# Patient Record
Sex: Female | Born: 1950 | Race: White | Hispanic: No | Smoking: Never smoker
Health system: Southern US, Community
[De-identification: ages and names within clinical notes are randomized; demographics above are authoritative.]

## PROBLEM LIST (undated history)

## (undated) DIAGNOSIS — I10 Essential (primary) hypertension: Secondary | ICD-10-CM

## (undated) DIAGNOSIS — L570 Actinic keratosis: Secondary | ICD-10-CM

## (undated) DIAGNOSIS — T7840XA Allergy, unspecified, initial encounter: Secondary | ICD-10-CM

## (undated) DIAGNOSIS — D649 Anemia, unspecified: Secondary | ICD-10-CM

## (undated) HISTORY — DX: Anemia, unspecified: D64.9

## (undated) HISTORY — DX: Actinic keratosis: L57.0

## (undated) HISTORY — PX: TUBAL LIGATION: SHX77

## (undated) HISTORY — DX: Essential (primary) hypertension: I10

## (undated) HISTORY — DX: Allergy, unspecified, initial encounter: T78.40XA

---

## 1999-07-05 ENCOUNTER — Other Ambulatory Visit: Admission: RE | Admit: 1999-07-05 | Discharge: 1999-07-05 | Payer: Self-pay | Admitting: Family Medicine

## 2001-09-07 ENCOUNTER — Other Ambulatory Visit: Admission: RE | Admit: 2001-09-07 | Discharge: 2001-09-07 | Payer: Self-pay | Admitting: Family Medicine

## 2002-09-21 ENCOUNTER — Other Ambulatory Visit: Admission: RE | Admit: 2002-09-21 | Discharge: 2002-09-21 | Payer: Self-pay | Admitting: Family Medicine

## 2003-09-28 ENCOUNTER — Other Ambulatory Visit: Admission: RE | Admit: 2003-09-28 | Discharge: 2003-09-28 | Payer: Self-pay | Admitting: Family Medicine

## 2004-11-27 ENCOUNTER — Ambulatory Visit: Payer: Self-pay | Admitting: Family Medicine

## 2004-11-27 ENCOUNTER — Other Ambulatory Visit: Admission: RE | Admit: 2004-11-27 | Discharge: 2004-11-27 | Payer: Self-pay | Admitting: Family Medicine

## 2004-12-09 ENCOUNTER — Ambulatory Visit: Payer: Self-pay | Admitting: Family Medicine

## 2004-12-18 ENCOUNTER — Encounter: Admission: RE | Admit: 2004-12-18 | Discharge: 2004-12-18 | Payer: Self-pay | Admitting: Family Medicine

## 2004-12-20 ENCOUNTER — Encounter: Admission: RE | Admit: 2004-12-20 | Discharge: 2004-12-20 | Payer: Self-pay | Admitting: Family Medicine

## 2005-01-01 ENCOUNTER — Ambulatory Visit: Payer: Self-pay | Admitting: Family Medicine

## 2006-01-16 ENCOUNTER — Ambulatory Visit: Payer: Self-pay | Admitting: Family Medicine

## 2006-01-16 ENCOUNTER — Other Ambulatory Visit: Admission: RE | Admit: 2006-01-16 | Discharge: 2006-01-16 | Payer: Self-pay | Admitting: Family Medicine

## 2006-01-16 ENCOUNTER — Encounter: Payer: Self-pay | Admitting: Family Medicine

## 2006-03-12 ENCOUNTER — Encounter: Admission: RE | Admit: 2006-03-12 | Discharge: 2006-03-12 | Payer: Self-pay | Admitting: Family Medicine

## 2006-03-19 ENCOUNTER — Encounter: Admission: RE | Admit: 2006-03-19 | Discharge: 2006-03-19 | Payer: Self-pay | Admitting: Family Medicine

## 2006-03-20 ENCOUNTER — Ambulatory Visit: Payer: Self-pay | Admitting: Family Medicine

## 2007-01-29 ENCOUNTER — Ambulatory Visit: Payer: Self-pay | Admitting: Family Medicine

## 2007-01-29 ENCOUNTER — Encounter: Payer: Self-pay | Admitting: Family Medicine

## 2007-01-29 ENCOUNTER — Other Ambulatory Visit: Admission: RE | Admit: 2007-01-29 | Discharge: 2007-01-29 | Payer: Self-pay | Admitting: Family Medicine

## 2007-01-29 LAB — CONVERTED CEMR LAB: Pap Smear: NORMAL

## 2007-03-24 ENCOUNTER — Encounter: Admission: RE | Admit: 2007-03-24 | Discharge: 2007-03-24 | Payer: Self-pay | Admitting: Family Medicine

## 2007-03-29 ENCOUNTER — Telehealth (INDEPENDENT_AMBULATORY_CARE_PROVIDER_SITE_OTHER): Payer: Self-pay | Admitting: *Deleted

## 2007-12-29 ENCOUNTER — Encounter: Payer: Self-pay | Admitting: Family Medicine

## 2008-03-01 ENCOUNTER — Encounter: Payer: Self-pay | Admitting: Family Medicine

## 2008-03-01 ENCOUNTER — Ambulatory Visit: Payer: Self-pay | Admitting: Family Medicine

## 2008-03-01 ENCOUNTER — Other Ambulatory Visit: Admission: RE | Admit: 2008-03-01 | Discharge: 2008-03-01 | Payer: Self-pay | Admitting: Family Medicine

## 2008-03-01 DIAGNOSIS — E785 Hyperlipidemia, unspecified: Secondary | ICD-10-CM | POA: Insufficient documentation

## 2008-03-01 DIAGNOSIS — R3911 Hesitancy of micturition: Secondary | ICD-10-CM | POA: Insufficient documentation

## 2008-03-01 DIAGNOSIS — K219 Gastro-esophageal reflux disease without esophagitis: Secondary | ICD-10-CM

## 2008-03-01 DIAGNOSIS — I10 Essential (primary) hypertension: Secondary | ICD-10-CM

## 2008-03-01 DIAGNOSIS — N951 Menopausal and female climacteric states: Secondary | ICD-10-CM | POA: Insufficient documentation

## 2008-03-01 DIAGNOSIS — N6019 Diffuse cystic mastopathy of unspecified breast: Secondary | ICD-10-CM

## 2008-03-01 DIAGNOSIS — M810 Age-related osteoporosis without current pathological fracture: Secondary | ICD-10-CM | POA: Insufficient documentation

## 2008-03-01 LAB — CONVERTED CEMR LAB
Bilirubin Urine: NEGATIVE
Epithelial cells, urine: 1 /lpf
Nitrite: NEGATIVE
RBC / HPF: 0
Specific Gravity, Urine: 1.01
Urine crystals, microscopic: 0 /hpf

## 2008-03-02 LAB — CONVERTED CEMR LAB
AST: 21 units/L (ref 0–37)
Alkaline Phosphatase: 66 units/L (ref 39–117)
BUN: 13 mg/dL (ref 6–23)
Basophils Absolute: 0 10*3/uL (ref 0.0–0.1)
Bilirubin, Direct: 0.1 mg/dL (ref 0.0–0.3)
Chloride: 102 meq/L (ref 96–112)
Eosinophils Absolute: 0 10*3/uL (ref 0.0–0.7)
Eosinophils Relative: 0.5 % (ref 0.0–5.0)
GFR calc non Af Amer: 69 mL/min
HDL: 53.1 mg/dL (ref >39.0)
MCV: 93.3 fL (ref 78.0–100.0)
Neutrophils Relative %: 73.4 % (ref 43.0–77.0)
Platelets: 329 10*3/uL (ref 150–400)
Potassium: 3.4 meq/L — ABNORMAL LOW (ref 3.5–5.1)
Total Bilirubin: 1.2 mg/dL (ref 0.3–1.2)
VLDL: 18 mg/dL (ref 0–40)
WBC: 5.4 10*3/uL (ref 4.5–10.5)

## 2008-03-06 ENCOUNTER — Encounter (INDEPENDENT_AMBULATORY_CARE_PROVIDER_SITE_OTHER): Payer: Self-pay | Admitting: *Deleted

## 2008-03-29 ENCOUNTER — Encounter: Admission: RE | Admit: 2008-03-29 | Discharge: 2008-03-29 | Payer: Self-pay | Admitting: Family Medicine

## 2008-03-31 ENCOUNTER — Encounter (INDEPENDENT_AMBULATORY_CARE_PROVIDER_SITE_OTHER): Payer: Self-pay | Admitting: *Deleted

## 2008-07-03 ENCOUNTER — Telehealth (INDEPENDENT_AMBULATORY_CARE_PROVIDER_SITE_OTHER): Payer: Self-pay | Admitting: *Deleted

## 2009-04-18 ENCOUNTER — Telehealth: Payer: Self-pay | Admitting: Family Medicine

## 2009-05-23 ENCOUNTER — Ambulatory Visit: Payer: Self-pay | Admitting: Family Medicine

## 2009-05-23 LAB — CONVERTED CEMR LAB
ALT: 22 units/L (ref 0–35)
Albumin: 3.9 g/dL (ref 3.5–5.2)
BUN: 20 mg/dL (ref 6–23)
Basophils Absolute: 0 10*3/uL (ref 0.0–0.1)
CO2: 31 meq/L (ref 19–32)
Chloride: 105 meq/L (ref 96–112)
Cholesterol: 185 mg/dL (ref 0–200)
Glucose, Bld: 86 mg/dL (ref 70–99)
HCT: 38.4 % (ref 36.0–46.0)
HDL: 68 mg/dL (ref >39.00)
Lymphs Abs: 1.3 10*3/uL (ref 0.7–4.0)
MCV: 92.7 fL (ref 78.0–100.0)
Monocytes Absolute: 0.4 10*3/uL (ref 0.1–1.0)
Neutro Abs: 2.5 10*3/uL (ref 1.4–7.7)
Platelets: 236 10*3/uL (ref 150.0–400.0)
Potassium: 3.8 meq/L (ref 3.5–5.1)
RDW: 11.4 % — ABNORMAL LOW (ref 11.5–14.6)
TSH: 0.75 microintl units/mL (ref 0.35–5.50)
Total Bilirubin: 0.9 mg/dL (ref 0.3–1.2)
VLDL: 10 mg/dL (ref 0.0–40.0)

## 2009-06-06 ENCOUNTER — Other Ambulatory Visit: Admission: RE | Admit: 2009-06-06 | Discharge: 2009-06-06 | Payer: Self-pay | Admitting: Family Medicine

## 2009-06-06 ENCOUNTER — Encounter: Payer: Self-pay | Admitting: Family Medicine

## 2009-06-06 ENCOUNTER — Ambulatory Visit: Payer: Self-pay | Admitting: Family Medicine

## 2009-06-06 DIAGNOSIS — R4589 Other symptoms and signs involving emotional state: Secondary | ICD-10-CM

## 2009-06-11 ENCOUNTER — Encounter: Admission: RE | Admit: 2009-06-11 | Discharge: 2009-06-11 | Payer: Self-pay | Admitting: Family Medicine

## 2009-06-11 ENCOUNTER — Encounter (INDEPENDENT_AMBULATORY_CARE_PROVIDER_SITE_OTHER): Payer: Self-pay | Admitting: *Deleted

## 2009-06-14 ENCOUNTER — Encounter (INDEPENDENT_AMBULATORY_CARE_PROVIDER_SITE_OTHER): Payer: Self-pay | Admitting: *Deleted

## 2009-07-02 ENCOUNTER — Ambulatory Visit: Payer: Self-pay | Admitting: Family Medicine

## 2009-12-04 ENCOUNTER — Telehealth: Payer: Self-pay | Admitting: Family Medicine

## 2010-12-07 IMAGING — MG MM SCREEN MAMMOGRAM BILATERAL
5 series · 5 of 5 positions shown · non-contrast
Comparison: none

DG SCREEN MAMMOGRAM BILATERAL
Bilateral CC and MLO view(s) were taken.

DIGITAL SCREENING MAMMOGRAM WITH CAD:
The breast tissue is heterogeneously dense.  No masses or malignant type calcifications are 
identified.  Compared with prior studies.
Images were processed with CAD.

[R CC]
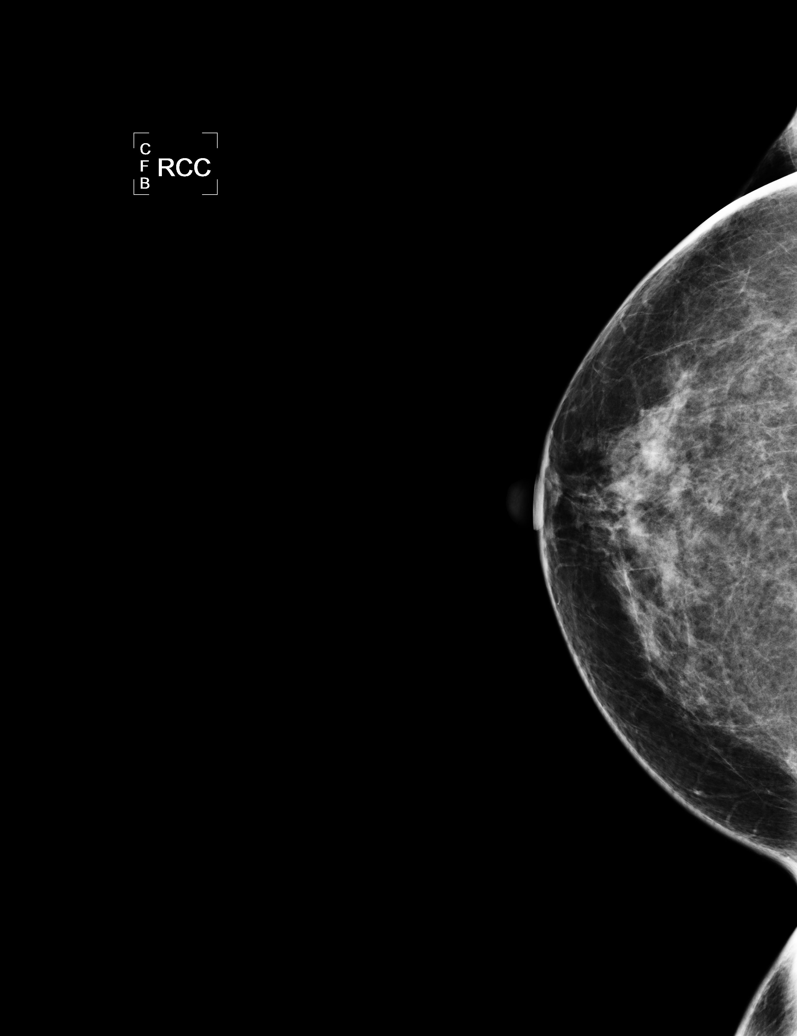

[L CC]
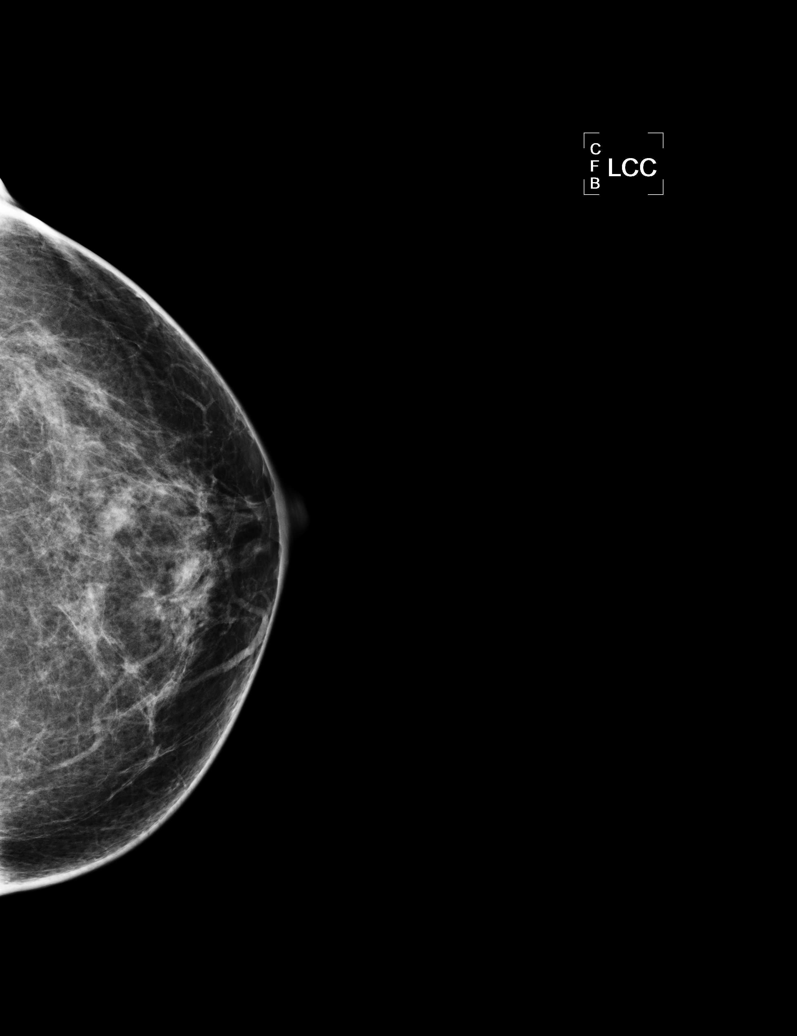

[L MLO (1 of 2)]
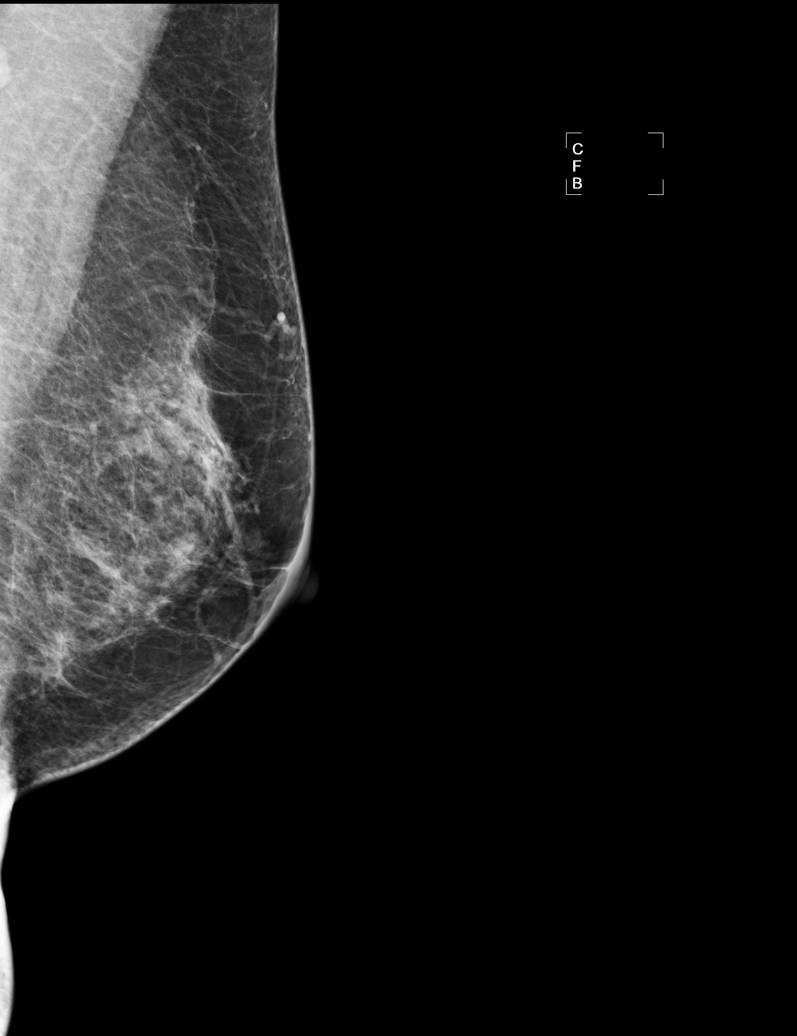

[R MLO]
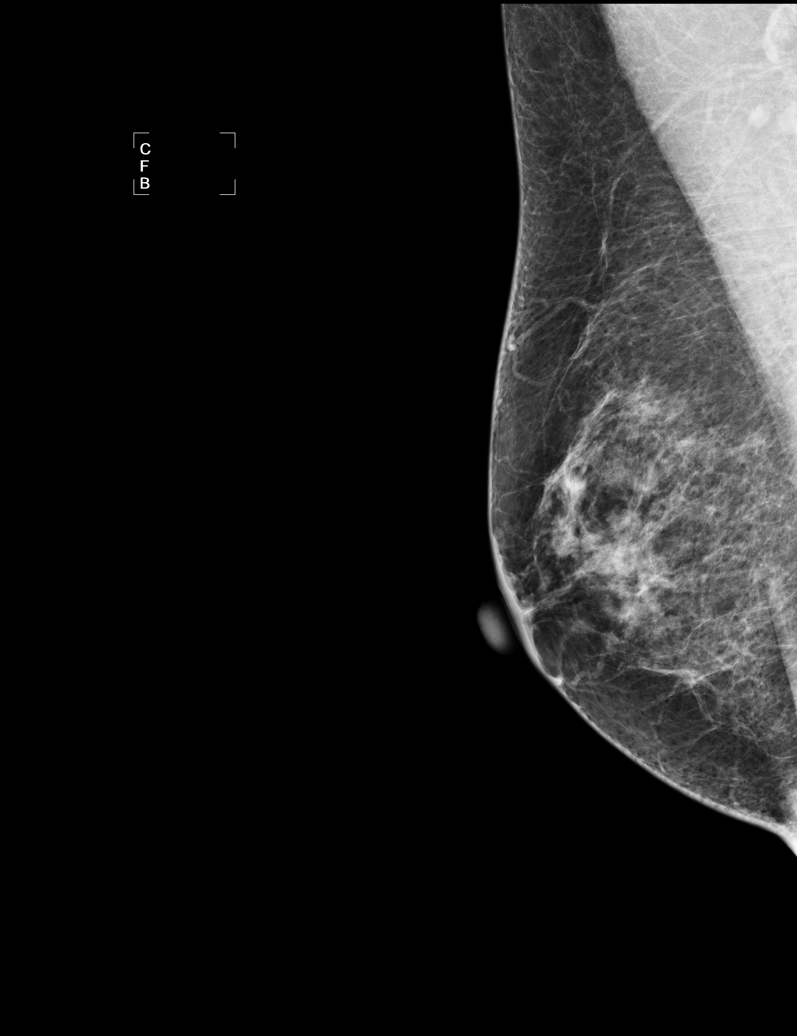

[L MLO (2 of 2)]
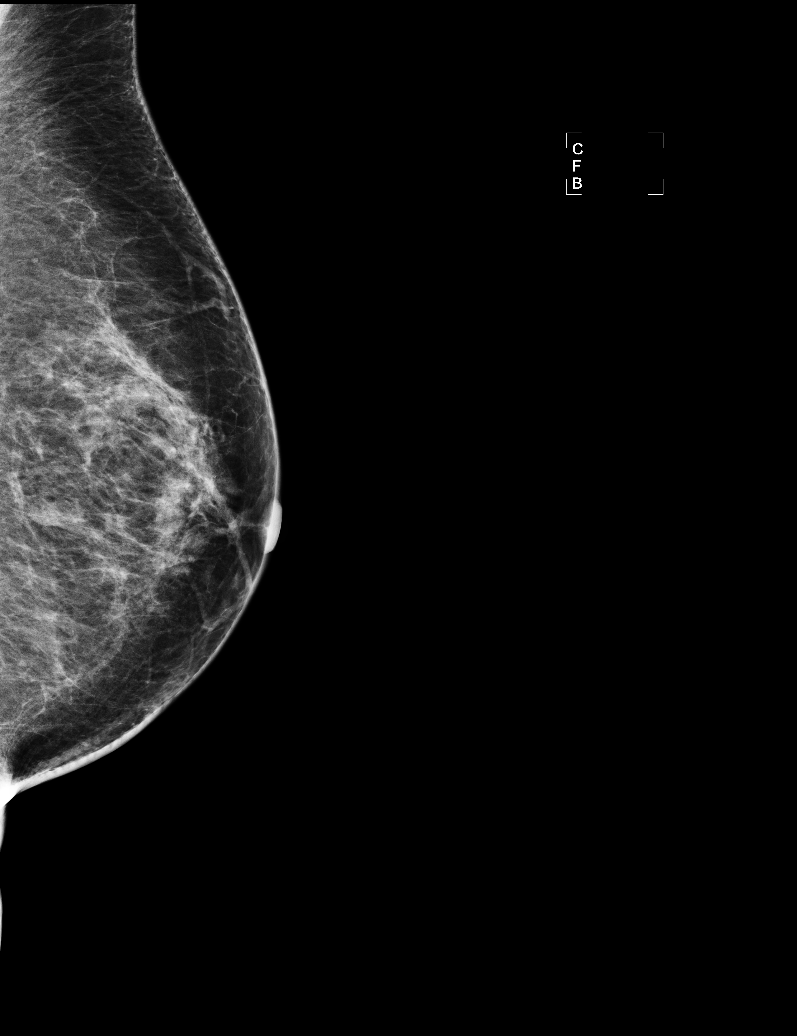

[5 of 5 positions shown; findings below may reference images not displayed]

IMPRESSION: No specific mammographic evidence of malignancy.  Next screening mammogram is recommended in one 
year.

A result letter of this screening mammogram will be mailed directly to the patient.

ASSESSMENT: Negative - BI-RADS 1

Screening mammogram in 1 year.
ANALYZED BY COMPUTER AIDED DETECTION. , THIS PROCEDURE WAS A DIGITAL MAMMOGRAM.

## 2010-12-21 ENCOUNTER — Encounter: Payer: Self-pay | Admitting: Family Medicine

## 2010-12-31 NOTE — Progress Notes (Signed)
Summary: [facility] refills  Phone Note Refill Request Message from:  [facility] on December 04, 2009 5:00 PM  Refills Requested: Medication #1:  HYDROCHLOROTHIAZIDE 25 MG  TABS take one by mouth daily  Medication #2:  COZAAR 100 MG  TABS take one by mouth daily  Medication #3:  MELOXICAM 15 MG  TABS 1 by mouth once daily with food as needed. Form on your desk    Method Requested: Fax to Mail Away Pharmacy Initial call taken by: Mervin Hack CMA Duncan Dull),  December 04, 2009 5:01 PM  Follow-up for Phone Call        form done and in nurse in box   Follow-up by: Judith Part MD,  December 05, 2009 7:56 AM  Additional Follow-up for Phone Call Additional follow up Details #1::        Forms faxed. Additional Follow-up by: Lowella Petties CMA,  December 05, 2009 9:13 AM    New/Updated Medications: HYDROCHLOROTHIAZIDE 25 MG  TABS (HYDROCHLOROTHIAZIDE) take one by mouth daily MELOXICAM 15 MG  TABS (MELOXICAM) 1 by mouth once daily with food as needed Prescriptions: MELOXICAM 15 MG  TABS (MELOXICAM) 1 by mouth once daily with food as needed  #90 x 3   Entered and Authorized by:   Judith Part MD   Signed by:   Lowella Petties CMA on 12/05/2009   Method used:   Historical   RxID:   [id number] COZAAR 100 MG  TABS (LOSARTAN POTASSIUM) take one by mouth daily  #90 x 3   Entered and Authorized by:   Judith Part MD   Signed by:   Lowella Petties CMA on 12/05/2009   Method used:   Historical   RxID:   [id number] HYDROCHLOROTHIAZIDE 25 MG  TABS (HYDROCHLOROTHIAZIDE) take one by mouth daily  #90 x 3   Entered and Authorized by:   Judith Part MD   Signed by:   Lowella Petties CMA on 12/05/2009   Method used:   Historical   RxID:   [id number]

## 2013-01-23 ENCOUNTER — Telehealth: Payer: Self-pay | Admitting: Family Medicine

## 2013-01-23 DIAGNOSIS — Z0000 Encounter for general adult medical examination without abnormal findings: Secondary | ICD-10-CM | POA: Insufficient documentation

## 2013-01-23 NOTE — Telephone Encounter (Signed)
Message copied by [name] on Sun Jan 23, 2013  1:55 PM ------      Message from: [name]      Created: Wed Jan 19, 2013 10:22 AM      Regarding: Lab orders for Monday, 2.24.14       Patient is scheduled for CPX labs, please order future labs, Thanks , Gabriella Santiago       ------

## 2013-01-24 ENCOUNTER — Other Ambulatory Visit (INDEPENDENT_AMBULATORY_CARE_PROVIDER_SITE_OTHER): Payer: BC Managed Care – PPO

## 2013-01-24 DIAGNOSIS — E785 Hyperlipidemia, unspecified: Secondary | ICD-10-CM

## 2013-01-24 DIAGNOSIS — Z0000 Encounter for general adult medical examination without abnormal findings: Secondary | ICD-10-CM

## 2013-01-24 LAB — CBC WITH DIFFERENTIAL/PLATELET
Basophils Relative: 0.7 % (ref 0.0–3.0)
Eosinophils Absolute: 0.1 10*3/uL (ref 0.0–0.7)
Eosinophils Relative: 2.9 % (ref 0.0–5.0)
HCT: 42.4 % (ref 36.0–46.0)
Lymphs Abs: 1.5 10*3/uL (ref 0.7–4.0)
MCHC: 34.4 g/dL (ref 30.0–36.0)
MCV: 89.4 fl (ref 78.0–100.0)
Monocytes Absolute: 0.3 10*3/uL (ref 0.1–1.0)
Neutro Abs: 2.2 10*3/uL (ref 1.4–7.7)
Neutrophils Relative %: 52.9 % (ref 43.0–77.0)
RBC: 4.75 Mil/uL (ref 3.87–5.11)

## 2013-01-24 LAB — LIPID PANEL
Cholesterol: 212 mg/dL — ABNORMAL HIGH (ref 0–200)
HDL: 62 mg/dL (ref >39.00)
Total CHOL/HDL Ratio: 3
Triglycerides: 120 mg/dL (ref 0.0–149.0)
VLDL: 24 mg/dL (ref 0.0–40.0)

## 2013-01-24 LAB — COMPREHENSIVE METABOLIC PANEL
AST: 21 U/L (ref 0–37)
Alkaline Phosphatase: 68 U/L (ref 39–117)
BUN: 14 mg/dL (ref 6–23)
Creatinine, Ser: 0.8 mg/dL (ref 0.4–1.2)
Glucose, Bld: 83 mg/dL (ref 70–99)
Potassium: 4.2 mEq/L (ref 3.5–5.1)
Total Bilirubin: 0.8 mg/dL (ref 0.3–1.2)

## 2013-01-24 LAB — TSH: TSH: 1.08 u[IU]/mL (ref 0.35–5.50)

## 2013-01-31 ENCOUNTER — Encounter: Payer: Self-pay | Admitting: Family Medicine

## 2013-01-31 ENCOUNTER — Ambulatory Visit (INDEPENDENT_AMBULATORY_CARE_PROVIDER_SITE_OTHER): Payer: BC Managed Care – PPO | Admitting: Family Medicine

## 2013-01-31 ENCOUNTER — Other Ambulatory Visit (HOSPITAL_COMMUNITY)
Admission: RE | Admit: 2013-01-31 | Discharge: 2013-01-31 | Disposition: A | Discharge status: Home / Self Care | Payer: BC Managed Care – PPO | Source: Ambulatory Visit | Attending: Family Medicine | Admitting: Family Medicine

## 2013-01-31 VITALS — BP 178/110 | HR 100 | Temp 98.4°F | Ht 58.5 in | Wt 161.8 lb

## 2013-01-31 DIAGNOSIS — Z1151 Encounter for screening for human papillomavirus (HPV): Secondary | ICD-10-CM | POA: Insufficient documentation

## 2013-01-31 DIAGNOSIS — Z01419 Encounter for gynecological examination (general) (routine) without abnormal findings: Secondary | ICD-10-CM | POA: Insufficient documentation

## 2013-01-31 MED ORDER — LOSARTAN POTASSIUM-HCTZ 50-12.5 MG PO TABS: 1.0000 | ORAL_TABLET | Freq: Every day | ORAL | Status: DC

## 2013-01-31 NOTE — Assessment & Plan Note (Signed)
Schedule dexa Rev ca and D  Has taken fosamax in the past  Will begin vit D3 otc 2000 iu daily for D def

## 2013-01-31 NOTE — Assessment & Plan Note (Signed)
Disc goals for lipids and reasons to control them Rev labs with pt Rev low sat fat diet in detail   

## 2013-01-31 NOTE — Progress Notes (Signed)
Subjective:    Patient ID: Gabriella Santiago, female    DOB: January 04, 1951, 62 y.o.   MRN: [id number]  HPI Here for health maintenance exam and to review chronic medical problems    Wt is up 22 lb with bmi over 30  She lost and then gained back  She had a bad breakup - and then ate a lot  Is back on track - cutting down -- more healthy foods and is exercising    bp is quite elevated Has not been taking her medicine She was on cozaar and hct - stopped it because her bp went down with wt loss  BP Readings from Last 3 Encounters:  01/31/13 178/110  06/06/09 130/80  03/01/08 138/90    Pap 7/10 Has not had a hysterectomy Is time for her 3 year pap No problems   Flu vaccine--got it jan 8th  Colon cancer screen-not ready for that yet - but would consider in the summer  Will do IFOB card No bowel problems  mammo 7/10- needs to get that scheduled  Self exam-no lumps or changes   Zoster status-is interested in getting the vaccine   Openia dexa 8/10- at [facility] office  D level is 19  Mood - is better now (her break up was 2 years ago)  Patient Active Problem List  Diagnosis  . HYPERCHOLESTEROLEMIA, BORDERLINE  . STRESS REACTION, ACUTE, WITH EMOTIONAL DISTURBANCE  . HYPERTENSION  . GERD  . FIBROCYSTIC BREAST DISEASE  . POSTMENOPAUSAL STATUS  . OSTEOPENIA  . URINARY HESITANCY  . Routine general medical examination at a health care facility  . Encounter for routine gynecological examination  . Colon cancer screening   No past medical history on file. No past surgical history on file. History  Substance Use Topics  . Smoking status: Never Smoker   . Smokeless tobacco: Not on file  . Alcohol Use: No   No family history on file. Allergies  Allergen Reactions  . Shellfish Allergy Rash   No current outpatient prescriptions on file prior to visit.   No current facility-administered medications on file prior to visit.     Review of Systems Review of Systems   Constitutional: Negative for fever, appetite change, fatigue and unexpected weight change.  Eyes: Negative for pain and visual disturbance.  Respiratory: Negative for cough and shortness of breath.   Cardiovascular: Negative for cp or palpitations    Gastrointestinal: Negative for nausea, diarrhea and constipation.  Genitourinary: Negative for urgency and frequency.  Skin: Negative for pallor or rash   Neurological: Negative for weakness, light-headedness, numbness and headaches.  Hematological: Negative for adenopathy. Does not bruise/bleed easily.  Psychiatric/Behavioral: Negative for dysphoric mood. The patient is not nervous/anxious.  some stressors        Objective:   Physical Exam  Constitutional: She appears well-developed and well-nourished. No distress.  obese and well appearing   HENT:  Head: Normocephalic and atraumatic.  Mouth/Throat: Oropharynx is clear and moist.  Eyes: Conjunctivae and EOM are normal. Pupils are equal, round, and reactive to light. Right eye exhibits no discharge. Left eye exhibits no discharge.  Neck: Normal range of motion. Neck supple. No JVD present. Carotid bruit is not present. No thyromegaly present.  Cardiovascular: Normal rate, regular rhythm, normal heart sounds and intact distal pulses.  Exam reveals no gallop.   Pulmonary/Chest: Effort normal and breath sounds normal. No respiratory distress. She has no wheezes.  Abdominal: Soft. Bowel sounds are normal. She exhibits no distension, no  abdominal bruit and no mass. There is no tenderness.  Genitourinary: Vagina normal and uterus normal. No breast swelling, tenderness, discharge or bleeding. There is no rash, tenderness or lesion on the right labia. There is no rash, tenderness or lesion on the left labia. Uterus is not enlarged and not tender. Cervix exhibits no motion tenderness, no discharge and no friability. Right adnexum displays no mass, no tenderness and no fullness. Left adnexum displays no  mass, no tenderness and no fullness. No bleeding around the vagina. No vaginal discharge found.  Breast exam: No mass, nodules, thickening, tenderness, bulging, retraction, inflamation, nipple discharge or skin changes noted.  No axillary or clavicular LA.  Chaperoned exam.    Musculoskeletal: She exhibits no edema and no tenderness.  Lymphadenopathy:    She has no cervical adenopathy.  Neurological: She is alert. She has normal reflexes. No cranial nerve deficit. She exhibits normal muscle tone. Coordination normal.  Skin: Skin is warm and dry. No rash noted. No erythema. No pallor.  Psychiatric: She has a normal mood and affect.          Assessment & Plan:

## 2013-01-31 NOTE — Assessment & Plan Note (Signed)
Reviewed health habits including diet and exercise and skin cancer prevention Also reviewed health mt list, fam hx and immunizations  Rev wellness labs in detail 

## 2013-01-31 NOTE — Patient Instructions (Addendum)
Don't forget to schedule your mammogram appt  If you are interested in a shingles/zoster vaccine - call your insurance to check on coverage,( you should not get it within 1 month of other vaccines) , then call us for a prescription  for it to take to a pharmacy that gives the shot , or make a nurse visit to get it here depending on your coverage We will schedule bone density test at check out Please do stool card for colon cancer screening Take vitamin D3 over the counter 2000 iu daily   follow up in about 4-6 weeks for blood pressure check with me  If you do buy a cuff- OMRON for the arm is what I recomment

## 2013-01-31 NOTE — Assessment & Plan Note (Signed)
Pt will do ifob- not yet ready to schedule colonoscopy but may do so later in the year No bowel problems

## 2013-01-31 NOTE — Assessment & Plan Note (Signed)
bp is up significantly - after stopping her losartan hct- will re start at dose 50-12.5 and update if problems F/u about 1 mo and she may also purchase a bp cuff for home Rev lifestyle change- diet and exercise

## 2013-01-31 NOTE — Assessment & Plan Note (Signed)
Exam and pap done- no complaints

## 2013-02-03 ENCOUNTER — Encounter: Payer: Self-pay | Admitting: *Deleted

## 2013-02-07 ENCOUNTER — Ambulatory Visit (INDEPENDENT_AMBULATORY_CARE_PROVIDER_SITE_OTHER)
Admission: RE | Admit: 2013-02-07 | Discharge: 2013-02-07 | Disposition: A | Discharge status: Home / Self Care | Payer: BC Managed Care – PPO | Source: Ambulatory Visit | Attending: Family Medicine | Admitting: Family Medicine

## 2013-02-07 ENCOUNTER — Inpatient Hospital Stay: Admission: RE | Admit: 2013-02-07 | Payer: BC Managed Care – PPO | Source: Ambulatory Visit

## 2013-03-11 ENCOUNTER — Encounter: Payer: Self-pay | Admitting: Family Medicine

## 2013-03-11 ENCOUNTER — Encounter: Payer: Self-pay | Admitting: *Deleted

## 2013-03-14 ENCOUNTER — Encounter: Payer: Self-pay | Admitting: Family Medicine

## 2013-03-14 ENCOUNTER — Ambulatory Visit (INDEPENDENT_AMBULATORY_CARE_PROVIDER_SITE_OTHER): Payer: BC Managed Care – PPO | Admitting: Family Medicine

## 2013-03-14 VITALS — BP 138/88 | HR 84 | Temp 98.2°F | Ht 58.5 in | Wt 159.8 lb

## 2013-03-14 DIAGNOSIS — I10 Essential (primary) hypertension: Secondary | ICD-10-CM

## 2013-03-14 DIAGNOSIS — M949 Disorder of cartilage, unspecified: Secondary | ICD-10-CM

## 2013-03-14 LAB — BASIC METABOLIC PANEL
CO2: 29 mEq/L (ref 19–32)
Calcium: 9.4 mg/dL (ref 8.4–10.5)
Chloride: 102 mEq/L (ref 96–112)
Creatinine, Ser: 0.8 mg/dL (ref 0.4–1.2)
Glucose, Bld: 88 mg/dL (ref 70–99)

## 2013-03-14 MED ORDER — LOSARTAN POTASSIUM 50 MG PO TABS: 50.0000 mg | ORAL_TABLET | Freq: Every day | ORAL | Status: DC

## 2013-03-14 MED ORDER — HYDROCHLOROTHIAZIDE 12.5 MG PO CAPS: 12.5000 mg | ORAL_CAPSULE | Freq: Every day | ORAL | Status: DC

## 2013-03-14 NOTE — Assessment & Plan Note (Signed)
Improved with losartan hct - we separated those px for convenience  Lab today bmp Disc diet/exercise-- she is working hard on wt loss

## 2013-03-14 NOTE — Assessment & Plan Note (Signed)
bmd did dec 2% in FN- overall fairly stable S/p 5 y of fosamax and pt does exercise No fx at all  Disc lifestyle habits  Will re check in 2 y

## 2013-03-14 NOTE — Progress Notes (Signed)
  Subjective:    Patient ID: Gabriella Santiago, female    DOB: 03/11/1951, 62 y.o.   MRN: [id number]  HPI Here for f/u of HTN   Went back on her losartan hct 50-12.5 (wants to split that up into 2 pills )  bp is improved today At home - her neighbor (a nurse) checks it and it has been fine  No cp or palpitations or headaches or edema  No side effects to medicines  BP Readings from Last 3 Encounters:  03/14/13 146/92  01/31/13 178/110  06/06/09 130/80     Bone density  OP- fairly stable overall - lowest T score -2.9  Is walking Plans to do strength training too  Is loosing weight Has had 5 y of fosamax in the past  No fractures or falls    Patient Active Problem List  Diagnosis  . HYPERCHOLESTEROLEMIA, BORDERLINE  . STRESS REACTION, ACUTE, WITH EMOTIONAL DISTURBANCE  . HYPERTENSION  . GERD  . FIBROCYSTIC BREAST DISEASE  . POSTMENOPAUSAL STATUS  . OSTEOPENIA  . URINARY HESITANCY  . Routine general medical examination at a health care facility  . Encounter for routine gynecological examination  . Colon cancer screening   No past medical history on file. No past surgical history on file. History  Substance Use Topics  . Smoking status: Never Smoker   . Smokeless tobacco: Not on file  . Alcohol Use: No   No family history on file. Allergies  Allergen Reactions  . Shellfish Allergy Rash   No current outpatient prescriptions on file prior to visit.   No current facility-administered medications on file prior to visit.    Review of Systems Review of Systems  Constitutional: Negative for fever, appetite change, fatigue and unexpected weight change.  Eyes: Negative for pain and visual disturbance.  Respiratory: Negative for cough and shortness of breath.   Cardiovascular: Negative for cp or palpitations    Gastrointestinal: Negative for nausea, diarrhea and constipation.  Genitourinary: Negative for urgency and frequency.  Skin: Negative for pallor or rash    Neurological: Negative for weakness, light-headedness, numbness and headaches.  Hematological: Negative for adenopathy. Does not bruise/bleed easily.  Psychiatric/Behavioral: Negative for dysphoric mood. The patient is not nervous/anxious.         Objective:   Physical Exam  Constitutional: She appears well-developed and well-nourished. No distress.  HENT:  Head: Normocephalic and atraumatic.  Mouth/Throat: Oropharynx is clear and moist.  Eyes: Conjunctivae and EOM are normal. Pupils are equal, round, and reactive to light. No scleral icterus.  Neck: Normal range of motion. Neck supple. No JVD present. Carotid bruit is not present. No thyromegaly present.  Cardiovascular: Normal rate and regular rhythm.   Pulmonary/Chest: Effort normal and breath sounds normal. No respiratory distress. She has no wheezes.  Abdominal: She exhibits no abdominal bruit.  Musculoskeletal: She exhibits no edema and no tenderness.  No kyphosis   Lymphadenopathy:    She has no cervical adenopathy.  Neurological: She is alert. She has normal reflexes.  Skin: Skin is warm and dry. No rash noted. No pallor.  Psychiatric: She has a normal mood and affect.          Assessment & Plan:

## 2013-03-14 NOTE — Patient Instructions (Addendum)
Your blood pressure is improved  I sent the 2 different px to the pharmacy  Your bone density is down very slightly- so keep taking calcium with D and exercise

## 2013-03-15 ENCOUNTER — Encounter: Payer: Self-pay | Admitting: Family Medicine

## 2015-03-12 ENCOUNTER — Encounter: Payer: Self-pay | Admitting: Family Medicine

## 2015-03-12 ENCOUNTER — Ambulatory Visit (INDEPENDENT_AMBULATORY_CARE_PROVIDER_SITE_OTHER): Payer: BLUE CROSS/BLUE SHIELD | Admitting: Family Medicine

## 2015-03-12 VITALS — BP 160/100 | HR 109 | Temp 98.6°F | Wt 162.4 lb

## 2015-03-12 DIAGNOSIS — I10 Essential (primary) hypertension: Secondary | ICD-10-CM

## 2015-03-12 MED ORDER — HYDROCHLOROTHIAZIDE 12.5 MG PO CAPS: 12.5000 mg | ORAL_CAPSULE | Freq: Every day | ORAL | Status: DC

## 2015-03-12 MED ORDER — LOSARTAN POTASSIUM 50 MG PO TABS: 50.0000 mg | ORAL_TABLET | Freq: Every day | ORAL | Status: DC

## 2015-03-12 NOTE — Assessment & Plan Note (Signed)
bp is not controlled-pt has been non compliant with medication  Disc imp of this  Refilled meds  Rev lifestyle habits and handout given on DASH diet  F/u 1-2 mo - visit and lab that day

## 2015-03-12 NOTE — Patient Instructions (Signed)
Take your blood pressure medicine every am  Try to eat healthy and get some exercise  If needed - hire some help for care giving to get you time to care for yourself  Follow up in 1-2 months for a visit-we will do labs that day

## 2015-03-12 NOTE — Progress Notes (Signed)
Subjective:    Patient ID: Gabriella Santiago, female    DOB: Apr 11, 1951, 64 y.o.   MRN: [id number]  HPI Here for f/u of chronic health problems   Allergy problems  Is on zyrtec and mucinex    bp is up  today - has not been compliant with bp medicine  Has been lost to f/u  No cp or palpitations or headaches or edema  No side effects to medicines  BP Readings from Last 3 Encounters:  03/12/15 160/100  03/14/13 138/88  01/31/13 178/110      Did not take her medicine today   Feels fine except for allergies   Wt is up 3 lb with b mi of 33   Very busy - taking care of 2 parents and another elderly person - no time for herself  Diet is not too bad  Not a lot of time for exercise   Lab Results  Component Value Date   CHOL 212* 01/24/2013   HDL 62.00 01/24/2013   LDLCALC 107* 05/23/2009   LDLDIRECT 116.8 01/24/2013   TRIG 120.0 01/24/2013   CHOLHDL 3 01/24/2013      Wants to do labs when she follows up   Patient Active Problem List   Diagnosis Date Noted  . Encounter for routine gynecological examination 01/31/2013  . Colon cancer screening 01/31/2013  . Routine general medical examination at a health care facility 01/23/2013  . STRESS REACTION, ACUTE, WITH EMOTIONAL DISTURBANCE 06/06/2009  . HYPERCHOLESTEROLEMIA, BORDERLINE 03/01/2008  . Essential hypertension 03/01/2008  . GERD 03/01/2008  . FIBROCYSTIC BREAST DISEASE 03/01/2008  . POSTMENOPAUSAL STATUS 03/01/2008  . OSTEOPENIA 03/01/2008  . URINARY HESITANCY 03/01/2008   No past medical history on file. No past surgical history on file. History  Substance Use Topics  . Smoking status: Never Smoker   . Smokeless tobacco: Not on file  . Alcohol Use: No   No family history on file. Allergies  Allergen Reactions  . Shellfish Allergy Rash   Current Outpatient Prescriptions on File Prior to Visit  Medication Sig Dispense Refill  . hydrochlorothiazide (MICROZIDE) 12.5 MG capsule Take 1 capsule (12.5 mg  total) by mouth daily. 30 capsule 11  . losartan (COZAAR) 50 MG tablet Take 1 tablet (50 mg total) by mouth daily. (Patient not taking: Reported on 03/12/2015) 30 tablet 11   No current facility-administered medications on file prior to visit.     Review of Systems Review of Systems  Constitutional: Negative for fever, appetite change, and unexpected weight change.  Eyes: Negative for pain and visual disturbance.  Respiratory: Negative for cough and shortness of breath.   Cardiovascular: Negative for cp or palpitations    Gastrointestinal: Negative for nausea, diarrhea and constipation.  Genitourinary: Negative for urgency and frequency.  Skin: Negative for pallor or rash   Neurological: Negative for weakness, light-headedness, numbness and headaches.  Hematological: Negative for adenopathy. Does not bruise/bleed easily.  Psychiatric/Behavioral: Negative for dysphoric mood. The patient is not nervous/anxious.  pos for stressors        Objective:   Physical Exam  Constitutional: She appears well-developed and well-nourished. No distress.  obese and well appearing   HENT:  Head: Normocephalic and atraumatic.  Mouth/Throat: Oropharynx is clear and moist.  Eyes: Conjunctivae and EOM are normal. Pupils are equal, round, and reactive to light.  Neck: Normal range of motion. Neck supple. No JVD present. Carotid bruit is not present. No thyromegaly present.  Cardiovascular: Normal rate, regular rhythm, normal heart  sounds and intact distal pulses.  Exam reveals no gallop.   Pulmonary/Chest: Effort normal and breath sounds normal. No respiratory distress. She has no wheezes. She has no rales.  No crackles  Abdominal: Soft. Bowel sounds are normal. She exhibits no distension, no abdominal bruit and no mass. There is no tenderness.  Musculoskeletal: She exhibits no edema.  Lymphadenopathy:    She has no cervical adenopathy.  Neurological: She is alert. She has normal reflexes.  Skin: Skin  is warm and dry. No rash noted.  Psychiatric: She has a normal mood and affect.  Pt seems fatigued and voices stressors           Assessment & Plan:   Problem List Items Addressed This Visit      Cardiovascular and Mediastinum   Essential hypertension - Primary    bp is not controlled-pt has been non compliant with medication  Disc imp of this  Refilled meds  Rev lifestyle habits and handout given on DASH diet  F/u 1-2 mo - visit and lab that day      Relevant Medications   hydrochlorothiazide (MICROZIDE) 12.5 MG capsule   losartan (COZAAR) tablet

## 2015-03-12 NOTE — Progress Notes (Signed)
Pre visit review using our clinic review tool, if applicable. No additional management support is needed unless otherwise documented below in the visit note. 

## 2015-05-11 ENCOUNTER — Encounter: Payer: Self-pay | Admitting: Family Medicine

## 2015-05-11 ENCOUNTER — Ambulatory Visit (INDEPENDENT_AMBULATORY_CARE_PROVIDER_SITE_OTHER): Payer: BLUE CROSS/BLUE SHIELD | Admitting: Family Medicine

## 2015-05-11 VITALS — BP 156/100 | HR 83 | Temp 98.1°F | Ht 58.5 in | Wt 157.5 lb

## 2015-05-11 DIAGNOSIS — I10 Essential (primary) hypertension: Secondary | ICD-10-CM | POA: Diagnosis not present

## 2015-05-11 DIAGNOSIS — E669 Obesity, unspecified: Secondary | ICD-10-CM | POA: Insufficient documentation

## 2015-05-11 MED ORDER — LOSARTAN POTASSIUM-HCTZ 100-25 MG PO TABS
1.0000 | ORAL_TABLET | Freq: Every day | ORAL | Status: DC
Start: 2015-05-11 — End: 2015-12-18

## 2015-05-11 NOTE — Progress Notes (Signed)
Pre visit review using our clinic review tool, if applicable. No additional management support is needed unless otherwise documented below in the visit note. 

## 2015-05-11 NOTE — Assessment & Plan Note (Signed)
Enc hard work thus far Discussed how this problem influences overall health and the risks it imposes  Reviewed plan for weight loss with lower calorie diet (via better food choices and also portion control or program like weight watchers) and exercise building up to or more than 30 minutes 5 days per week including some aerobic activity   5 lb off-will continue to work on it

## 2015-05-11 NOTE — Patient Instructions (Signed)
Keep working on [employer] and exercise for weight loss and blood pressure  Also avoid sodium and work on water intake  Follow up in about a month - we will do labs that day

## 2015-05-11 NOTE — Assessment & Plan Note (Signed)
Not at goal  Rev health habits and DASH diet- doing better  Inc dose to losartan 100 and hct 25  Update if side eff or problems  F/u 1 mo -will do lab that day

## 2015-05-11 NOTE — Progress Notes (Signed)
Subjective:    Patient ID: Gabriella Santiago, female    DOB: 1951-07-08, 64 y.o.   MRN: [id number]  HPI Here for f/u of HTN  Even with med compliance - bp still high   BP Readings from Last 3 Encounters:  05/11/15 156/100  03/12/15 160/100  03/14/13 138/88     Is loosing weight - down 5 lb with bmi of 32 - really trying to cut back   No HA or CP or other symptoms   Walking for exercise   Very busy   Patient Active Problem List   Diagnosis Date Noted  . Encounter for routine gynecological examination 01/31/2013  . Colon cancer screening 01/31/2013  . Routine general medical examination at a health care facility 01/23/2013  . STRESS REACTION, ACUTE, WITH EMOTIONAL DISTURBANCE 06/06/2009  . HYPERCHOLESTEROLEMIA, BORDERLINE 03/01/2008  . Essential hypertension 03/01/2008  . GERD 03/01/2008  . FIBROCYSTIC BREAST DISEASE 03/01/2008  . POSTMENOPAUSAL STATUS 03/01/2008  . OSTEOPENIA 03/01/2008  . URINARY HESITANCY 03/01/2008   No past medical history on file. No past surgical history on file. History  Substance Use Topics  . Smoking status: Never Smoker   . Smokeless tobacco: Not on file  . Alcohol Use: No   No family history on file. Allergies  Allergen Reactions  . Shellfish Allergy Rash   Current Outpatient Prescriptions on File Prior to Visit  Medication Sig Dispense Refill  . hydrochlorothiazide (MICROZIDE) 12.5 MG capsule Take 1 capsule (12.5 mg total) by mouth daily. 30 capsule 11  . losartan (COZAAR) 50 MG tablet Take 1 tablet (50 mg total) by mouth daily. 30 tablet 11   No current facility-administered medications on file prior to visit.      Review of Systems Review of Systems  Constitutional: Negative for fever, appetite change, fatigue and unexpected weight change.  Eyes: Negative for pain and visual disturbance.  Respiratory: Negative for cough and shortness of breath.   Cardiovascular: Negative for cp or palpitations    Gastrointestinal: Negative  for nausea, diarrhea and constipation.  Genitourinary: Negative for urgency and frequency.  Skin: Negative for pallor or rash   Neurological: Negative for weakness, light-headedness, numbness and headaches.  Hematological: Negative for adenopathy. Does not bruise/bleed easily.  Psychiatric/Behavioral: Negative for dysphoric mood. The patient is not nervous/anxious.         Objective:   Physical Exam  Constitutional: She appears well-developed and well-nourished. No distress.  obese and well appearing   HENT:  Head: Normocephalic and atraumatic.  Mouth/Throat: Oropharynx is clear and moist.  Eyes: Conjunctivae and EOM are normal. Pupils are equal, round, and reactive to light.  Neck: Normal range of motion. Neck supple. No JVD present. Carotid bruit is not present. No thyromegaly present.  Cardiovascular: Normal rate, regular rhythm, normal heart sounds and intact distal pulses.  Exam reveals no gallop.   Pulmonary/Chest: Effort normal and breath sounds normal. No respiratory distress. She has no wheezes. She has no rales.  No crackles  Abdominal: Soft. Bowel sounds are normal. She exhibits no distension, no abdominal bruit and no mass. There is no tenderness.  Musculoskeletal: She exhibits no edema.  Lymphadenopathy:    She has no cervical adenopathy.  Neurological: She is alert. She has normal reflexes.  Skin: Skin is warm and dry. No rash noted.  Psychiatric: She has a normal mood and affect.          Assessment & Plan:   Problem List Items Addressed This Visit  Essential hypertension - Primary    Not at goal  Rev health habits and DASH diet- doing better  Inc dose to losartan 100 and hct 25  Update if side eff or problems  F/u 1 mo -will do lab that day       Relevant Medications   losartan-hydrochlorothiazide (HYZAAR) 100-25 MG per tablet   Obesity    Enc hard work thus far Discussed how this problem influences overall health and the risks it imposes  Reviewed  plan for weight loss with lower calorie diet (via better food choices and also portion control or program like weight watchers) and exercise building up to or more than 30 minutes 5 days per week including some aerobic activity   5 lb off-will continue to work on it

## 2015-05-28 ENCOUNTER — Telehealth: Payer: Self-pay | Admitting: *Deleted

## 2015-05-28 NOTE — Telephone Encounter (Signed)
LMOM to return my call about mammo. 

## 2015-05-28 NOTE — Telephone Encounter (Signed)
Pt left v/m returning call and request cb [phone].

## 2015-05-29 NOTE — Telephone Encounter (Signed)
Spoke with patient. Info given for [facility] to call and schedule mammo.

## 2015-06-15 ENCOUNTER — Ambulatory Visit (INDEPENDENT_AMBULATORY_CARE_PROVIDER_SITE_OTHER): Payer: BLUE CROSS/BLUE SHIELD | Admitting: Family Medicine

## 2015-06-15 ENCOUNTER — Encounter: Payer: Self-pay | Admitting: Family Medicine

## 2015-06-15 VITALS — BP 138/70 | HR 98 | Temp 97.3°F | Ht 59.0 in | Wt 155.8 lb

## 2015-06-15 DIAGNOSIS — E785 Hyperlipidemia, unspecified: Secondary | ICD-10-CM

## 2015-06-15 DIAGNOSIS — I10 Essential (primary) hypertension: Secondary | ICD-10-CM

## 2015-06-15 LAB — CBC WITH DIFFERENTIAL/PLATELET
BASOS ABS: 0 10*3/uL (ref 0.0–0.1)
Basophils Relative: 0.4 % (ref 0.0–3.0)
Eosinophils Absolute: 0.1 10*3/uL (ref 0.0–0.7)
Eosinophils Relative: 0.9 % (ref 0.0–5.0)
HCT: 41.1 % (ref 36.0–46.0)
Hemoglobin: 14.3 g/dL (ref 12.0–15.0)
LYMPHS PCT: 16.7 % (ref 12.0–46.0)
Lymphs Abs: 1 10*3/uL (ref 0.7–4.0)
MCHC: 34.8 g/dL (ref 30.0–36.0)
MCV: 90.7 fl (ref 78.0–100.0)
MONO ABS: 0.4 10*3/uL (ref 0.1–1.0)
Monocytes Relative: 6 % (ref 3.0–12.0)
Neutro Abs: 4.7 10*3/uL (ref 1.4–7.7)
Neutrophils Relative %: 76 % (ref 43.0–77.0)
Platelets: 296 10*3/uL (ref 150.0–400.0)
RBC: 4.53 Mil/uL (ref 3.87–5.11)
RDW: 12.2 % (ref 11.5–15.5)
WBC: 6.2 10*3/uL (ref 4.0–10.5)

## 2015-06-15 LAB — COMPREHENSIVE METABOLIC PANEL
ALT: 19 U/L (ref 0–35)
AST: 20 U/L (ref 0–37)
Albumin: 4.3 g/dL (ref 3.5–5.2)
Alkaline Phosphatase: 64 U/L (ref 39–117)
BUN: 19 mg/dL (ref 6–23)
CALCIUM: 9.7 mg/dL (ref 8.4–10.5)
CO2: 31 mEq/L (ref 19–32)
Chloride: 102 mEq/L (ref 96–112)
Creatinine, Ser: 0.86 mg/dL (ref 0.40–1.20)
GFR: 70.97 mL/min (ref >60.00)
Glucose, Bld: 99 mg/dL (ref 70–99)
Potassium: 3.6 mEq/L (ref 3.5–5.1)
SODIUM: 141 meq/L (ref 135–145)
Total Bilirubin: 1 mg/dL (ref 0.2–1.2)
Total Protein: 7 g/dL (ref 6.0–8.3)

## 2015-06-15 LAB — LIPID PANEL
Cholesterol: 200 mg/dL (ref 0–200)
HDL: 65.2 mg/dL (ref >39.00)
LDL Cholesterol: 123 mg/dL — ABNORMAL HIGH (ref 0–99)
NonHDL: 134.8
Total CHOL/HDL Ratio: 3
Triglycerides: 61 mg/dL (ref 0.0–149.0)
VLDL: 12.2 mg/dL (ref 0.0–40.0)

## 2015-06-15 LAB — TSH: TSH: 0.86 u[IU]/mL (ref 0.35–4.50)

## 2015-06-15 NOTE — Assessment & Plan Note (Signed)
bp in fair control at this time  BP Readings from Last 1 Encounters:  06/15/15 138/70   No changes needed Disc lifstyle change with low sodium diet and exercise  Better with losartan hct 100/25 -and tolerating well Enc continued wt loss  Lab today

## 2015-06-15 NOTE — Progress Notes (Signed)
Pre visit review using our clinic review tool, if applicable. No additional management support is needed unless otherwise documented below in the visit note. 

## 2015-06-15 NOTE — Progress Notes (Signed)
Subjective:    Patient ID: Gabriella Santiago, female    DOB: 10/19/1951, 64 y.o.   MRN: [id number]  HPI Here for f/u of HTN   Wt is down 2 lb with bmi of 31 Is really trying to loose weight - cutting back on portions and drinking water  Also watching sodium   bp is improved today Last visit inc her med - losartan hct 100/25 No cp or palpitations or headaches or edema  No side effects to medicines  BP Readings from Last 3 Encounters:  06/15/15 138/70  05/11/15 156/100  03/12/15 160/100     Has checked at home - coming down to about that   No side effects or problems   Patient Active Problem List   Diagnosis Date Noted  . Obesity 05/11/2015  . Encounter for routine gynecological examination 01/31/2013  . Colon cancer screening 01/31/2013  . Routine general medical examination at a health care facility 01/23/2013  . STRESS REACTION, ACUTE, WITH EMOTIONAL DISTURBANCE 06/06/2009  . HYPERCHOLESTEROLEMIA, BORDERLINE 03/01/2008  . Essential hypertension 03/01/2008  . GERD 03/01/2008  . FIBROCYSTIC BREAST DISEASE 03/01/2008  . POSTMENOPAUSAL STATUS 03/01/2008  . OSTEOPENIA 03/01/2008  . URINARY HESITANCY 03/01/2008   No past medical history on file. No past surgical history on file. History  Substance Use Topics  . Smoking status: Never Smoker   . Smokeless tobacco: Not on file  . Alcohol Use: No   No family history on file. Allergies  Allergen Reactions  . Shellfish Allergy Rash   Current Outpatient Prescriptions on File Prior to Visit  Medication Sig Dispense Refill  . losartan-hydrochlorothiazide (HYZAAR) 100-25 MG per tablet Take 1 tablet by mouth daily. 30 tablet 11   No current facility-administered medications on file prior to visit.     Review of Systems Review of Systems  Constitutional: Negative for fever, appetite change, fatigue and unexpected weight change.  Eyes: Negative for pain and visual disturbance.  Respiratory: Negative for cough and  shortness of breath.   Cardiovascular: Negative for cp or palpitations    Gastrointestinal: Negative for nausea, diarrhea and constipation.  Genitourinary: Negative for urgency and frequency.  Skin: Negative for pallor or rash   Neurological: Negative for weakness, light-headedness, numbness and headaches.  Hematological: Negative for adenopathy. Does not bruise/bleed easily.  Psychiatric/Behavioral: Negative for dysphoric mood. The patient is not nervous/anxious.  pos for stressors        Objective:   Physical Exam  Constitutional: She appears well-developed and well-nourished. No distress.  obese and well appearing   HENT:  Head: Normocephalic and atraumatic.  Mouth/Throat: Oropharynx is clear and moist.  Eyes: Conjunctivae and EOM are normal. Pupils are equal, round, and reactive to light.  Neck: Normal range of motion. Neck supple. No JVD present. Carotid bruit is not present. No thyromegaly present.  Cardiovascular: Normal rate, regular rhythm, normal heart sounds and intact distal pulses.  Exam reveals no gallop.   Pulmonary/Chest: Effort normal and breath sounds normal. No respiratory distress. She has no wheezes. She has no rales.  No crackles  Abdominal: Soft. Bowel sounds are normal. She exhibits no distension, no abdominal bruit and no mass. There is no tenderness.  Musculoskeletal: She exhibits no edema.  Lymphadenopathy:    She has no cervical adenopathy.  Neurological: She is alert. She has normal reflexes.  Skin: Skin is warm and dry. No rash noted.  Psychiatric: She has a normal mood and affect.  Assessment & Plan:   Problem List Items Addressed This Visit    Essential hypertension - Primary    bp in fair control at this time  BP Readings from Last 1 Encounters:  06/15/15 138/70   No changes needed Disc lifstyle change with low sodium diet and exercise  Better with losartan hct 100/25 -and tolerating well Enc continued wt loss  Lab today         Relevant Orders   CBC with Differential/Platelet   Comprehensive metabolic panel   TSH   Lipid panel   Hyperlipidemia, mild    Lab today  Diet is improved lately- working on wt loss as well BP is improved       Relevant Orders   Lipid panel

## 2015-06-15 NOTE — Patient Instructions (Signed)
BP is better  Lab today  Follow up for annual exam in about 6 months with labs prior   Take care of yourself

## 2015-06-15 NOTE — Assessment & Plan Note (Signed)
Lab today  Diet is improved lately- working on wt loss as well BP is improved

## 2015-07-30 ENCOUNTER — Telehealth: Payer: Self-pay

## 2015-07-30 NOTE — Telephone Encounter (Signed)
Left a message for patient to remind her of being due for a Mammogram. I notified patient that if she needed information on how/where to get one scheduled, she could return call to the office.

## 2015-12-09 ENCOUNTER — Telehealth: Payer: Self-pay | Admitting: Family Medicine

## 2015-12-09 DIAGNOSIS — Z0000 Encounter for general adult medical examination without abnormal findings: Secondary | ICD-10-CM

## 2015-12-09 NOTE — Telephone Encounter (Signed)
-----   Message from Marchia Bond sent at 12/04/2015 10:47 AM EST ----- Regarding: cpx labs Tues 1/10, need orders. Thanks! :-) Please order  future cpx labs for pt's upcoming lab appt. Thanks Aniceto Boss

## 2015-12-11 ENCOUNTER — Other Ambulatory Visit: Payer: BLUE CROSS/BLUE SHIELD

## 2015-12-14 ENCOUNTER — Other Ambulatory Visit (INDEPENDENT_AMBULATORY_CARE_PROVIDER_SITE_OTHER): Payer: BLUE CROSS/BLUE SHIELD

## 2015-12-14 DIAGNOSIS — Z0000 Encounter for general adult medical examination without abnormal findings: Secondary | ICD-10-CM | POA: Diagnosis not present

## 2015-12-14 LAB — COMPREHENSIVE METABOLIC PANEL
ALK PHOS: 73 U/L (ref 39–117)
ALT: 19 U/L (ref 0–35)
AST: 15 U/L (ref 0–37)
Albumin: 4.1 g/dL (ref 3.5–5.2)
BUN: 21 mg/dL (ref 6–23)
CHLORIDE: 102 meq/L (ref 96–112)
CO2: 31 meq/L (ref 19–32)
Calcium: 9.4 mg/dL (ref 8.4–10.5)
Creatinine, Ser: 0.76 mg/dL (ref 0.40–1.20)
GFR: 81.72 mL/min (ref >60.00)
GLUCOSE: 88 mg/dL (ref 70–99)
POTASSIUM: 3.5 meq/L (ref 3.5–5.1)
SODIUM: 140 meq/L (ref 135–145)
TOTAL PROTEIN: 7 g/dL (ref 6.0–8.3)
Total Bilirubin: 0.7 mg/dL (ref 0.2–1.2)

## 2015-12-14 LAB — CBC WITH DIFFERENTIAL/PLATELET
Basophils Absolute: 0 10*3/uL (ref 0.0–0.1)
Basophils Relative: 0.6 % (ref 0.0–3.0)
Eosinophils Absolute: 0.2 10*3/uL (ref 0.0–0.7)
Eosinophils Relative: 2.3 % (ref 0.0–5.0)
HEMATOCRIT: 41.9 % (ref 36.0–46.0)
Hemoglobin: 14.4 g/dL (ref 12.0–15.0)
LYMPHS ABS: 1.6 10*3/uL (ref 0.7–4.0)
Lymphocytes Relative: 24.2 % (ref 12.0–46.0)
MCHC: 34.3 g/dL (ref 30.0–36.0)
MCV: 90.2 fl (ref 78.0–100.0)
MONO ABS: 0.3 10*3/uL (ref 0.1–1.0)
Monocytes Relative: 4.8 % (ref 3.0–12.0)
Neutro Abs: 4.4 10*3/uL (ref 1.4–7.7)
Neutrophils Relative %: 68.1 % (ref 43.0–77.0)
Platelets: 346 10*3/uL (ref 150.0–400.0)
RBC: 4.64 Mil/uL (ref 3.87–5.11)
RDW: 11.9 % (ref 11.5–15.5)
WBC: 6.4 10*3/uL (ref 4.0–10.5)

## 2015-12-14 LAB — LIPID PANEL
CHOL/HDL RATIO: 3
Cholesterol: 197 mg/dL (ref 0–200)
HDL: 59.5 mg/dL (ref >39.00)
LDL CALC: 121 mg/dL — AB (ref 0–99)
NONHDL: 137.89
Triglycerides: 83 mg/dL (ref 0.0–149.0)
VLDL: 16.6 mg/dL (ref 0.0–40.0)

## 2015-12-14 LAB — TSH: TSH: 0.64 u[IU]/mL (ref 0.35–4.50)

## 2015-12-18 ENCOUNTER — Other Ambulatory Visit (HOSPITAL_COMMUNITY)
Admission: RE | Admit: 2015-12-18 | Discharge: 2015-12-18 | Disposition: A | Discharge status: Home / Self Care | Payer: BLUE CROSS/BLUE SHIELD | Source: Ambulatory Visit | Attending: Family Medicine | Admitting: Family Medicine

## 2015-12-18 ENCOUNTER — Ambulatory Visit (INDEPENDENT_AMBULATORY_CARE_PROVIDER_SITE_OTHER): Payer: BLUE CROSS/BLUE SHIELD | Admitting: Family Medicine

## 2015-12-18 ENCOUNTER — Encounter: Payer: Self-pay | Admitting: Family Medicine

## 2015-12-18 VITALS — BP 130/80 | HR 81 | Temp 98.3°F | Ht 58.25 in | Wt 161.0 lb

## 2015-12-18 DIAGNOSIS — Z01419 Encounter for gynecological examination (general) (routine) without abnormal findings: Secondary | ICD-10-CM | POA: Diagnosis present

## 2015-12-18 DIAGNOSIS — M810 Age-related osteoporosis without current pathological fracture: Secondary | ICD-10-CM | POA: Diagnosis not present

## 2015-12-18 DIAGNOSIS — E785 Hyperlipidemia, unspecified: Secondary | ICD-10-CM

## 2015-12-18 DIAGNOSIS — Z1211 Encounter for screening for malignant neoplasm of colon: Secondary | ICD-10-CM

## 2015-12-18 DIAGNOSIS — E669 Obesity, unspecified: Secondary | ICD-10-CM

## 2015-12-18 DIAGNOSIS — I10 Essential (primary) hypertension: Secondary | ICD-10-CM | POA: Diagnosis not present

## 2015-12-18 DIAGNOSIS — Z0000 Encounter for general adult medical examination without abnormal findings: Secondary | ICD-10-CM

## 2015-12-18 LAB — HM PAP SMEAR: HM Pap smear: NORMAL

## 2015-12-18 MED ORDER — LOSARTAN POTASSIUM-HCTZ 100-25 MG PO TABS: 1.0000 | ORAL_TABLET | Freq: Every day | ORAL | Status: DC

## 2015-12-18 NOTE — Progress Notes (Signed)
Pre visit review using our clinic review tool, if applicable. No additional management support is needed unless otherwise documented below in the visit note. 

## 2015-12-18 NOTE — Patient Instructions (Signed)
Please do the stool card for colon cancer screening  Also schedule your annual [facility] at [facility] If you are interested in a shingles/zoster vaccine - call your insurance to check on coverage,( you should not get it within 1 month of other vaccines) , then call us for a prescription  for it to take to a pharmacy that gives the shot , or make a nurse visit to get it here depending on your coverage  Pap done today Try to get 1200-1500 mg of calcium per day with at least 1000 iu of vitamin D - for bone health  Call us when you are ready to schedule your bone density test

## 2015-12-18 NOTE — Progress Notes (Signed)
Subjective:    Patient ID: Gabriella Santiago, female    DOB: [birth year], 65 y.o.   MRN: [id number]  HPI Here for health maintenance exam and to review chronic medical problems    Doing ok overall  Had a viral uri earlier this mo - got better   Wt is up 6 lb  Obese with bmi of 33 No time for self care - is not exercising but plans to start walking - has a video Eating "pretty decent" Did not overeat during the holidays   Cares for elderly people  Father is improved  Overall improved    Hep C/ HIV screen Has not been sexually active lately  No IV drug use  No blood transfusions before 1980  Declines   Colon cancer screening  Not interested in a colonoscopy yet   Mm 7/10 -was her last one  Self exam- no lumps   Zoster vaccine - is interested in it  Will find out if ins covers it   Flu shot 11/16  Td 4/09  Pap 3/14 negative Not sexually active  No abn paps since her early 65s (cryotherapy) No periods  Has not had a hysterectomy    dexa 4/14 OP -bone loss  No falls or fractures  She does not take her calcium and D  Wants to do a dexa in the spring -she will call back to schedule that when she knows her schedule   bp is stable today  No cp or palpitations or headaches or edema  No side effects to medicines  BP Readings from Last 3 Encounters:  12/18/15 136/86  06/15/15 138/70  05/11/15 156/100     No side eff from medicines  Results for orders placed or performed in visit on 12/14/15  CBC with Differential/Platelet  Result Value Ref Range   WBC 6.4 4.0 - 10.5 K/uL   RBC 4.64 3.87 - 5.11 Mil/uL   Hemoglobin 14.4 12.0 - 15.0 g/dL   HCT 41.9 36.0 - 46.0 %   MCV 90.2 78.0 - 100.0 fl   MCHC 34.3 30.0 - 36.0 g/dL   RDW 11.9 11.5 - 15.5 %   Platelets 346.0 150.0 - 400.0 K/uL   Neutrophils Relative % 68.1 43.0 - 77.0 %   Lymphocytes Relative 24.2 12.0 - 46.0 %   Monocytes Relative 4.8 3.0 - 12.0 %   Eosinophils Relative 2.3 0.0 - 5.0 %   Basophils  Relative 0.6 0.0 - 3.0 %   Neutro Abs 4.4 1.4 - 7.7 K/uL   Lymphs Abs 1.6 0.7 - 4.0 K/uL   Monocytes Absolute 0.3 0.1 - 1.0 K/uL   Eosinophils Absolute 0.2 0.0 - 0.7 K/uL   Basophils Absolute 0.0 0.0 - 0.1 K/uL  Comprehensive metabolic panel  Result Value Ref Range   Sodium 140 135 - 145 mEq/L   Potassium 3.5 3.5 - 5.1 mEq/L   Chloride 102 96 - 112 mEq/L   CO2 31 19 - 32 mEq/L   Glucose, Bld 88 70 - 99 mg/dL   BUN 21 6 - 23 mg/dL   Creatinine, Ser 0.76 0.40 - 1.20 mg/dL   Total Bilirubin 0.7 0.2 - 1.2 mg/dL   Alkaline Phosphatase 73 39 - 117 U/L   AST 15 0 - 37 U/L   ALT 19 0 - 35 U/L   Total Protein 7.0 6.0 - 8.3 g/dL   Albumin 4.1 3.5 - 5.2 g/dL   Calcium 9.4 8.4 - 10.5 mg/dL   GFR 81.72 >  60.00 mL/min  Lipid panel  Result Value Ref Range   Cholesterol 197 0 - 200 mg/dL   Triglycerides 83.0 0.0 - 149.0 mg/dL   HDL 59.50 >39.00 mg/dL   VLDL 16.6 0.0 - 40.0 mg/dL   LDL Cholesterol 121 (H) 0 - 99 mg/dL   Total CHOL/HDL Ratio 3    NonHDL 137.89   TSH  Result Value Ref Range   TSH 0.64 0.35 - 4.50 uIU/mL    Does watch diet for fats   Patient Active Problem List   Diagnosis Date Noted  . Obesity 05/11/2015  . Encounter for routine gynecological examination 01/31/2013  . Colon cancer screening 01/31/2013  . Routine general medical examination at a health care facility 01/23/2013  . STRESS REACTION, ACUTE, WITH EMOTIONAL DISTURBANCE 06/06/2009  . Hyperlipidemia, mild 03/01/2008  . Essential hypertension 03/01/2008  . GERD 03/01/2008  . FIBROCYSTIC BREAST DISEASE 03/01/2008  . POSTMENOPAUSAL STATUS 03/01/2008  . Osteoporosis 03/01/2008  . URINARY HESITANCY 03/01/2008   No past medical history on file. No past surgical history on file. Social History  Substance Use Topics  . Smoking status: Never Smoker   . Smokeless tobacco: None  . Alcohol Use: No   No family history on file. Allergies  Allergen Reactions  . Shellfish Allergy Rash   No current outpatient  prescriptions on file prior to visit.   No current facility-administered medications on file prior to visit.     Review of Systems Review of Systems  Constitutional: Negative for fever, appetite change, fatigue and unexpected weight change.  Eyes: Negative for pain and visual disturbance.  Respiratory: Negative for cough and shortness of breath.   Cardiovascular: Negative for cp or palpitations    Gastrointestinal: Negative for nausea, diarrhea and constipation.  Genitourinary: Negative for urgency and frequency.  Skin: Negative for pallor or rash   Neurological: Negative for weakness, light-headedness, numbness and headaches.  Hematological: Negative for adenopathy. Does not bruise/bleed easily.  Psychiatric/Behavioral: Negative for dysphoric mood. The patient is not nervous/anxious.         Objective:   Physical Exam  Constitutional: She appears well-developed and well-nourished. No distress.  obese and well appearing   HENT:  Head: Normocephalic and atraumatic.  Right Ear: External ear normal.  Left Ear: External ear normal.  Mouth/Throat: Oropharynx is clear and moist.  Eyes: Conjunctivae and EOM are normal. Pupils are equal, round, and reactive to light. No scleral icterus.  Neck: Normal range of motion. Neck supple. No JVD present. Carotid bruit is not present. No thyromegaly present.  Cardiovascular: Normal rate, regular rhythm, normal heart sounds and intact distal pulses.  Exam reveals no gallop.   Pulmonary/Chest: Effort normal and breath sounds normal. No respiratory distress. She has no wheezes. She exhibits no tenderness.  Abdominal: Soft. Bowel sounds are normal. She exhibits no distension, no abdominal bruit and no mass. There is no tenderness.  Genitourinary: No breast swelling, tenderness, discharge or bleeding. There is no rash, tenderness or lesion on the right labia. There is no rash, tenderness or lesion on the left labia. Uterus is not enlarged and not tender.  Cervix exhibits no motion tenderness, no discharge and no friability. Right adnexum displays no mass, no tenderness and no fullness. Left adnexum displays no mass, no tenderness and no fullness. No tenderness or bleeding in the vagina. No vaginal discharge found.  Breast exam: No mass, nodules, thickening, tenderness, bulging, retraction, inflamation, nipple discharge or skin changes noted.  No axillary or clavicular LA.  Musculoskeletal: Normal range of motion. She exhibits no edema or tenderness.  Lymphadenopathy:    She has no cervical adenopathy.  Neurological: She is alert. She has normal reflexes. No cranial nerve deficit. She exhibits normal muscle tone. Coordination normal.  Skin: Skin is warm and dry. No rash noted. No erythema. No pallor.  Solar lentigos diffusely   Psychiatric: She has a normal mood and affect.          Assessment & Plan:   Problem List Items Addressed This Visit      Cardiovascular and Mediastinum   Essential hypertension    bp in fair control at this time  BP Readings from Last 1 Encounters:  12/18/15 130/80   No changes needed Disc lifstyle change with low sodium diet and exercise  Labs reviewed       Relevant Medications   losartan-hydrochlorothiazide (HYZAAR) 100-25 MG tablet     Musculoskeletal and Integument   Osteoporosis    dexa 4/14 Will schedule next one  Disc need for calcium/ vitamin D/ wt bearing exercise and bone density test every 2 y to monitor Disc safety/ fracture risk in detail          Other   Colon cancer screening    Not interested in a colonoscopy yet  Will do IFOB kit -given      Relevant Orders   Fecal occult blood, imunochemical   Encounter for routine gynecological examination    Exam and pap done today No c/o      Relevant Orders   Cytology - PAP   Hyperlipidemia, mild    Fairly controlled with diet  Disc goals for lipids and reasons to control them Rev labs with pt Rev low sat fat diet in  detail       Relevant Medications   losartan-hydrochlorothiazide (HYZAAR) 100-25 MG tablet   Obesity    Discussed how this problem influences overall health and the risks it imposes  Reviewed plan for weight loss with lower calorie diet (via better food choices and also portion control or program like weight watchers) and exercise building up to or more than 30 minutes 5 days per week including some aerobic activity         Routine general medical examination at a health care facility - Primary    Reviewed health habits including diet and exercise and skin cancer prevention Reviewed appropriate screening tests for age  Also reviewed health mt list, fam hx and immunization status , as well as social and family history   See HPI Labs reviewed Please do the stool card for colon cancer screening  Also schedule your annual mammogram at the [facility] If you are interested in a shingles/zoster vaccine - call your insurance to check on coverage,( you should not get it within 1 month of other vaccines) , then call us for a prescription  for it to take to a pharmacy that gives the shot , or make a nurse visit to get it here depending on your coverage  Pap done today Try to get 1200-1500 mg of calcium per day with at least 1000 iu of vitamin D - for bone health  Call us when you are ready to schedule your bone density test

## 2015-12-19 NOTE — Assessment & Plan Note (Signed)
bp in fair control at this time  BP Readings from Last 1 Encounters:  12/18/15 130/80   No changes needed Disc lifstyle change with low sodium diet and exercise  Labs reviewed

## 2015-12-19 NOTE — Assessment & Plan Note (Signed)
dexa 4/14 Will schedule next one  Disc need for calcium/ vitamin D/ wt bearing exercise and bone density test every 2 y to monitor Disc safety/ fracture risk in detail

## 2015-12-19 NOTE — Assessment & Plan Note (Signed)
Exam and pap done today No c/o

## 2015-12-19 NOTE — Assessment & Plan Note (Signed)
Fairly controlled with diet  Disc goals for lipids and reasons to control them Rev labs with pt Rev low sat fat diet in detail

## 2015-12-19 NOTE — Assessment & Plan Note (Signed)
Not interested in a colonoscopy yet  Will do IFOB kit -given

## 2015-12-19 NOTE — Assessment & Plan Note (Signed)
Reviewed health habits including diet and exercise and skin cancer prevention Reviewed appropriate screening tests for age  Also reviewed health mt list, fam hx and immunization status , as well as social and family history   See HPI Labs reviewed Please do the stool card for colon cancer screening  Also schedule your annual mammogram at [facility] If you are interested in a shingles/zoster vaccine - call your insurance to check on coverage,( you should not get it within 1 month of other vaccines) , then call us for a prescription  for it to take to a pharmacy that gives the shot , or make a nurse visit to get it here depending on your coverage  Pap done today Try to get 1200-1500 mg of calcium per day with at least 1000 iu of vitamin D - for bone health  Call us when you are ready to schedule your bone density test

## 2015-12-19 NOTE — Assessment & Plan Note (Signed)
Discussed how this problem influences overall health and the risks it imposes  Reviewed plan for weight loss with lower calorie diet (via better food choices and also portion control or program like weight watchers) and exercise building up to or more than 30 minutes 5 days per week including some aerobic activity    

## 2015-12-20 LAB — CYTOLOGY - PAP

## 2015-12-21 ENCOUNTER — Encounter: Payer: Self-pay | Admitting: Family Medicine

## 2015-12-21 ENCOUNTER — Encounter: Payer: Self-pay | Admitting: *Deleted

## 2016-12-31 ENCOUNTER — Other Ambulatory Visit: Payer: Self-pay

## 2016-12-31 MED ORDER — LOSARTAN POTASSIUM-HCTZ 100-25 MG PO TABS: 1.0000 | ORAL_TABLET | Freq: Every day | ORAL | 2 refills | Status: DC

## 2016-12-31 NOTE — Telephone Encounter (Signed)
Pt request refill losartan-HCTZ to [facility]. Last refilled # 30 x 11 on 12/18/15. Pt has CPX scheduled 03/17/17. Refilled per protocol until CPX. Pt notified done.

## 2017-03-01 ENCOUNTER — Telehealth: Payer: Self-pay | Admitting: Family Medicine

## 2017-03-01 DIAGNOSIS — Z0000 Encounter for general adult medical examination without abnormal findings: Secondary | ICD-10-CM

## 2017-03-01 NOTE — Telephone Encounter (Signed)
-----   Message from Ellamae Sia sent at 02/23/2017  3:40 PM EDT ----- Regarding: Lab orders for Thursday, 4.12.18 Patient is scheduled for CPX labs, please order future labs, Thanks , Karna Christmas

## 2017-03-12 ENCOUNTER — Other Ambulatory Visit (INDEPENDENT_AMBULATORY_CARE_PROVIDER_SITE_OTHER): Payer: 59

## 2017-03-12 DIAGNOSIS — Z0000 Encounter for general adult medical examination without abnormal findings: Secondary | ICD-10-CM

## 2017-03-12 LAB — COMPREHENSIVE METABOLIC PANEL
ALK PHOS: 60 U/L (ref 39–117)
ALT: 19 U/L (ref 0–35)
AST: 17 U/L (ref 0–37)
Albumin: 4.1 g/dL (ref 3.5–5.2)
BILIRUBIN TOTAL: 0.9 mg/dL (ref 0.2–1.2)
BUN: 18 mg/dL (ref 6–23)
CALCIUM: 9.5 mg/dL (ref 8.4–10.5)
CO2: 31 meq/L (ref 19–32)
CREATININE: 0.87 mg/dL (ref 0.40–1.20)
Chloride: 103 mEq/L (ref 96–112)
GFR: 69.64 mL/min (ref >60.00)
Glucose, Bld: 95 mg/dL (ref 70–99)
Potassium: 3.9 mEq/L (ref 3.5–5.1)
Sodium: 139 mEq/L (ref 135–145)
Total Protein: 6.8 g/dL (ref 6.0–8.3)

## 2017-03-12 LAB — TSH: TSH: 1.64 u[IU]/mL (ref 0.35–4.50)

## 2017-03-12 LAB — CBC WITH DIFFERENTIAL/PLATELET
BASOS ABS: 0 10*3/uL (ref 0.0–0.1)
Basophils Relative: 1.1 % (ref 0.0–3.0)
EOS ABS: 0.1 10*3/uL (ref 0.0–0.7)
Eosinophils Relative: 2.9 % (ref 0.0–5.0)
HEMATOCRIT: 42.1 % (ref 36.0–46.0)
HEMOGLOBIN: 14.9 g/dL (ref 12.0–15.0)
LYMPHS PCT: 32.2 % (ref 12.0–46.0)
Lymphs Abs: 1.4 10*3/uL (ref 0.7–4.0)
MCHC: 35.4 g/dL (ref 30.0–36.0)
MCV: 88.4 fl (ref 78.0–100.0)
MONOS PCT: 9.9 % (ref 3.0–12.0)
Monocytes Absolute: 0.4 10*3/uL (ref 0.1–1.0)
NEUTROS ABS: 2.3 10*3/uL (ref 1.4–7.7)
Neutrophils Relative %: 53.9 % (ref 43.0–77.0)
PLATELETS: 280 10*3/uL (ref 150.0–400.0)
RBC: 4.77 Mil/uL (ref 3.87–5.11)
RDW: 12.6 % (ref 11.5–15.5)
WBC: 4.2 10*3/uL (ref 4.0–10.5)

## 2017-03-12 LAB — LIPID PANEL
CHOL/HDL RATIO: 3
Cholesterol: 196 mg/dL (ref 0–200)
HDL: 58.2 mg/dL (ref >39.00)
LDL Cholesterol: 121 mg/dL — ABNORMAL HIGH (ref 0–99)
NonHDL: 137.95
TRIGLYCERIDES: 85 mg/dL (ref 0.0–149.0)
VLDL: 17 mg/dL (ref 0.0–40.0)

## 2017-03-17 ENCOUNTER — Ambulatory Visit (INDEPENDENT_AMBULATORY_CARE_PROVIDER_SITE_OTHER): Payer: 59 | Admitting: Family Medicine

## 2017-03-17 ENCOUNTER — Encounter: Payer: Self-pay | Admitting: Family Medicine

## 2017-03-17 VITALS — BP 126/72 | HR 93 | Temp 98.4°F | Ht 58.25 in | Wt 162.5 lb

## 2017-03-17 DIAGNOSIS — Z1211 Encounter for screening for malignant neoplasm of colon: Secondary | ICD-10-CM

## 2017-03-17 DIAGNOSIS — I10 Essential (primary) hypertension: Secondary | ICD-10-CM | POA: Diagnosis not present

## 2017-03-17 DIAGNOSIS — E6609 Other obesity due to excess calories: Secondary | ICD-10-CM | POA: Diagnosis not present

## 2017-03-17 DIAGNOSIS — E785 Hyperlipidemia, unspecified: Secondary | ICD-10-CM

## 2017-03-17 DIAGNOSIS — M810 Age-related osteoporosis without current pathological fracture: Secondary | ICD-10-CM

## 2017-03-17 DIAGNOSIS — Z0000 Encounter for general adult medical examination without abnormal findings: Secondary | ICD-10-CM | POA: Diagnosis not present

## 2017-03-17 DIAGNOSIS — Z1231 Encounter for screening mammogram for malignant neoplasm of breast: Secondary | ICD-10-CM

## 2017-03-17 DIAGNOSIS — E2839 Other primary ovarian failure: Secondary | ICD-10-CM

## 2017-03-17 DIAGNOSIS — Z6833 Body mass index (BMI) 33.0-33.9, adult: Secondary | ICD-10-CM

## 2017-03-17 MED ORDER — LOSARTAN POTASSIUM-HCTZ 100-25 MG PO TABS: 1.0000 | ORAL_TABLET | Freq: Every day | ORAL | 11 refills | Status: DC

## 2017-03-17 NOTE — Progress Notes (Signed)
Pre visit review using our clinic review tool, if applicable. No additional management support is needed unless otherwise documented below in the visit note. 

## 2017-03-17 NOTE — Patient Instructions (Addendum)
We will refer you for a screening colonoscopy  Also mammogram and bone density test   If you are interested in a shingles/zoster vaccine (shingrix)- call your insurance to check on coverage,( you should not get it within 1 month of other vaccines) , then call us for a prescription  for it to take to a pharmacy that gives the shot , or make a nurse visit to get it here depending on your coverage   Try to get 1200-1500 mg of calcium per day with at least 1000 iu of vitamin D - for bone health  Put it in your pill box   For cholesterol   Avoid red meat/ fried foods/ egg yolks/ fatty breakfast meats/ butter, cheese and high fat dairy/ and shellfish

## 2017-03-17 NOTE — Progress Notes (Signed)
Subjective:    Patient ID: Gabriella Santiago, female    DOB: 1951-07-06, 66 y.o.   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)  No time for self care overall  Cut back on her part time job quite a lot-not working every single weekend    [employer] from Last 3 Encounters:  03/17/17 162 lb 8 oz (73.7 kg)  12/18/15 161 lb (73 kg)  06/15/15 155 lb 12.8 oz (70.7 kg)  stable  Diet - is a bit better/ making better choices - eating less bread /carbs and sweets  More grilled chicken and salad  bmi 33.6  Colonoscopy- schedule is the issue/needs someone to drive her   Mammogram 7/10 neg- [facility] center in the past  Self breast exam - no lumps   Declines hep C and hiv screen  Zoster vaccine- she is interested in a vaccine   Flu shot - [facility]   Tetanus shot 4/09  Pap 1/17 normal No new partners  No gyn symptoms   dexa 4/14 -osteoporosis  5 y of fosamax in the past  She is not always compliant with ca and D  No falls or fractures  Walking   bp is stable today  No cp or palpitations or headaches or edema  No side effects to medicines  BP Readings from Last 3 Encounters:  03/17/17 126/72  12/18/15 130/80  06/15/15 138/70     Hx of hyperlipidemia Lab Results  Component Value Date   CHOL 196 03/12/2017   CHOL 197 12/14/2015   CHOL 200 06/15/2015   Lab Results  Component Value Date   HDL 58.20 03/12/2017   HDL 59.50 12/14/2015   HDL 65.20 06/15/2015   Lab Results  Component Value Date   LDLCALC 121 (H) 03/12/2017   LDLCALC 121 (H) 12/14/2015   LDLCALC 123 (H) 06/15/2015   Lab Results  Component Value Date   TRIG 85.0 03/12/2017   TRIG 83.0 12/14/2015   TRIG 61.0 06/15/2015   Lab Results  Component Value Date   CHOLHDL 3 03/12/2017   CHOLHDL 3 12/14/2015   CHOLHDL 3 06/15/2015   Lab Results  Component Value  Date   LDLDIRECT 116.8 01/24/2013   Does watch her diet  occ eats burger and fries  No sausage or bacon  Cholesterol is stable   Results for orders placed or performed in visit on 03/12/17  CBC with Differential/Platelet  Result Value Ref Range   WBC 4.2 4.0 - 10.5 K/uL   RBC 4.77 3.87 - 5.11 Mil/uL   Hemoglobin 14.9 12.0 - 15.0 g/dL   HCT 42.1 36.0 - 46.0 %   MCV 88.4 78.0 - 100.0 fl   MCHC 35.4 30.0 - 36.0 g/dL   RDW 12.6 11.5 - 15.5 %   Platelets 280.0 150.0 - 400.0 K/uL   Neutrophils Relative % 53.9 43.0 - 77.0 %   Lymphocytes Relative 32.2 12.0 - 46.0 %   Monocytes Relative 9.9 3.0 - 12.0 %   Eosinophils Relative 2.9 0.0 - 5.0 %   Basophils Relative 1.1 0.0 - 3.0 %   Neutro Abs 2.3 1.4 - 7.7 K/uL   Lymphs Abs 1.4 0.7 - 4.0 K/uL   Monocytes Absolute 0.4 0.1 - 1.0 K/uL   Eosinophils Absolute 0.1 0.0 - 0.7 K/uL   Basophils Absolute 0.0 0.0 -  0.1 K/uL  Comprehensive metabolic panel  Result Value Ref Range   Sodium 139 135 - 145 mEq/L   Potassium 3.9 3.5 - 5.1 mEq/L   Chloride 103 96 - 112 mEq/L   CO2 31 19 - 32 mEq/L   Glucose, Bld 95 70 - 99 mg/dL   BUN 18 6 - 23 mg/dL   Creatinine, Ser 0.87 0.40 - 1.20 mg/dL   Total Bilirubin 0.9 0.2 - 1.2 mg/dL   Alkaline Phosphatase 60 39 - 117 U/L   AST 17 0 - 37 U/L   ALT 19 0 - 35 U/L   Total Protein 6.8 6.0 - 8.3 g/dL   Albumin 4.1 3.5 - 5.2 g/dL   Calcium 9.5 8.4 - 10.5 mg/dL   GFR 69.64 >60.00 mL/min  Lipid panel  Result Value Ref Range   Cholesterol 196 0 - 200 mg/dL   Triglycerides 85.0 0.0 - 149.0 mg/dL   HDL 58.20 >39.00 mg/dL   VLDL 17.0 0.0 - 40.0 mg/dL   LDL Cholesterol 121 (H) 0 - 99 mg/dL   Total CHOL/HDL Ratio 3    NonHDL 137.95   TSH  Result Value Ref Range   TSH 1.64 0.35 - 4.50 uIU/mL    Patient Active Problem List   Diagnosis Date Noted  . Screening mammogram, encounter for 03/17/2017  . Estrogen deficiency 03/17/2017  . Obesity 05/11/2015  . Encounter for routine gynecological examination  01/31/2013  . Colon cancer screening 01/31/2013  . Routine general medical examination at a health care facility 01/23/2013  . STRESS REACTION, ACUTE, WITH EMOTIONAL DISTURBANCE 06/06/2009  . Hyperlipidemia, mild 03/01/2008  . Essential hypertension 03/01/2008  . GERD 03/01/2008  . FIBROCYSTIC BREAST DISEASE 03/01/2008  . POSTMENOPAUSAL STATUS 03/01/2008  . Osteoporosis 03/01/2008  . URINARY HESITANCY 03/01/2008   No past medical history on file. No past surgical history on file. Social History  Substance Use Topics  . Smoking status: Never Smoker  . Smokeless tobacco: Never Used  . Alcohol use No   No family history on file. Allergies  Allergen Reactions  . Shellfish Allergy Rash   No current outpatient prescriptions on file prior to visit.   No current facility-administered medications on file prior to visit.     Review of Systems Review of Systems  Constitutional: Negative for fever, appetite change, and unexpected weight change.  Eyes: Negative for pain and visual disturbance.  Respiratory: Negative for cough and shortness of breath.   Cardiovascular: Negative for cp or palpitations    Gastrointestinal: Negative for nausea, diarrhea and constipation.  Genitourinary: Negative for urgency and frequency.  Skin: Negative for pallor or rash   Neurological: Negative for weakness, light-headedness, numbness and headaches.  Hematological: Negative for adenopathy. Does not bruise/bleed easily.  Psychiatric/Behavioral: Negative for dysphoric mood. The patient is not nervous/anxious.  pos for fatigue and caregiver stress        Objective:   Physical Exam  Constitutional: She appears well-developed and well-nourished. No distress.  obese and well appearing   HENT:  Head: Normocephalic and atraumatic.  Right Ear: External ear normal.  Left Ear: External ear normal.  Mouth/Throat: Oropharynx is clear and moist.  Eyes: Conjunctivae and EOM are normal. Pupils are equal,  round, and reactive to light. No scleral icterus.  Neck: Normal range of motion. Neck supple. No JVD present. Carotid bruit is not present. No thyromegaly present.  Cardiovascular: Normal rate, regular rhythm, normal heart sounds and intact distal pulses.  Exam reveals no gallop.  Pulmonary/Chest: Effort normal and breath sounds normal. No respiratory distress. She has no wheezes. She exhibits no tenderness.  Abdominal: Soft. Bowel sounds are normal. She exhibits no distension, no abdominal bruit and no mass. There is no tenderness.  Genitourinary: No breast swelling, tenderness, discharge or bleeding.  Genitourinary Comments: Breast exam: No mass, nodules, thickening, tenderness, bulging, retraction, inflamation, nipple discharge or skin changes noted.  No axillary or clavicular LA.      Musculoskeletal: Normal range of motion. She exhibits no edema or tenderness.  Lymphadenopathy:    She has no cervical adenopathy.  Neurological: She is alert. She has normal reflexes. No cranial nerve deficit. She exhibits normal muscle tone. Coordination normal.  Skin: Skin is warm and dry. No rash noted. No erythema. No pallor.  Solar lentigines diffusely Few sks  Psychiatric: She has a normal mood and affect.          Assessment & Plan:   Problem List Items Addressed This Visit      Cardiovascular and Mediastinum   Essential hypertension    bp in fair control at this time  BP Readings from Last 1 Encounters:  03/17/17 126/72   No changes needed Disc lifstyle change with low sodium diet and exercise  Labs reviewed       Relevant Medications   losartan-hydrochlorothiazide (HYZAAR) 100-25 MG tablet     Musculoskeletal and Integument   Osteoporosis    Due for dexa  No falls or fx Fosamax in the past Disc need for calcium/ vitamin D/ wt bearing exercise and bone density test every 2 y to monitor Disc safety/ fracture risk in detail          Other   Colon cancer screening    Ref to  GI for screening colonoscopy      Relevant Orders   Ambulatory referral to Gastroenterology   Estrogen deficiency   Relevant Orders   DG Bone Density   Hyperlipidemia, mild    Disc goals for lipids and reasons to control them Rev labs with pt Rev low sat fat diet in detail Disc diet  Handout given re: eating Motivated for changes       Relevant Medications   losartan-hydrochlorothiazide (HYZAAR) 100-25 MG tablet   Obesity    Discussed how this problem influences overall health and the risks it imposes  Reviewed plan for weight loss with lower calorie diet (via better food choices and also portion control or program like weight watchers) and exercise building up to or more than 30 minutes 5 days per week including some aerobic activity         Routine general medical examination at a health care facility - Primary    Reviewed health habits including diet and exercise and skin cancer prevention Reviewed appropriate screening tests for age  Also reviewed health mt list, fam hx and immunization status , as well as social and family history   See HPI Labs rev We will refer you for a screening colonoscopy  Also mammogram and bone density test   If you are interested in a shingles/zoster vaccine (shingrix)- call your insurance to check on coverage,( you should not get it within 1 month of other vaccines) , then call us for a prescription  for it to take to a pharmacy that gives the shot , or make a nurse visit to get it here depending on your coverage   Try to get 1200-1500 mg of calcium per day with at least 1000 iu  of vitamin D - for bone health  Put it in your pill box       Screening mammogram, encounter for    Scheduled annual screening mammogram Nl breast exam today  Encouraged monthly self exams        Relevant Orders   MM DIGITAL SCREENING BILATERAL

## 2017-03-18 NOTE — Assessment & Plan Note (Signed)
bp in fair control at this time  BP Readings from Last 1 Encounters:  03/17/17 126/72   No changes needed Disc lifstyle change with low sodium diet and exercise  Labs reviewed

## 2017-03-18 NOTE — Assessment & Plan Note (Signed)
Ref to GI for screening colonoscopy

## 2017-03-18 NOTE — Assessment & Plan Note (Signed)
Discussed how this problem influences overall health and the risks it imposes  Reviewed plan for weight loss with lower calorie diet (via better food choices and also portion control or program like weight watchers) and exercise building up to or more than 30 minutes 5 days per week including some aerobic activity    

## 2017-03-18 NOTE — Assessment & Plan Note (Signed)
Scheduled annual screening mammogram Nl breast exam today  Encouraged monthly self exams   

## 2017-03-18 NOTE — Assessment & Plan Note (Signed)
Disc goals for lipids and reasons to control them Rev labs with pt Rev low sat fat diet in detail Disc diet  Handout given re: eating Motivated for changes

## 2017-03-18 NOTE — Assessment & Plan Note (Signed)
Due for dexa  No falls or fx Fosamax in the past Disc need for calcium/ vitamin D/ wt bearing exercise and bone density test every 2 y to monitor Disc safety/ fracture risk in detail

## 2017-03-18 NOTE — Assessment & Plan Note (Signed)
Reviewed health habits including diet and exercise and skin cancer prevention Reviewed appropriate screening tests for age  Also reviewed health mt list, fam hx and immunization status , as well as social and family history   See HPI Labs rev We will refer you for a screening colonoscopy  Also mammogram and bone density test   If you are interested in a shingles/zoster vaccine (shingrix)- call your insurance to check on coverage,( you should not get it within 1 month of other vaccines) , then call us for a prescription  for it to take to a pharmacy that gives the shot , or make a nurse visit to get it here depending on your coverage   Try to get 1200-1500 mg of calcium per day with at least 1000 iu of vitamin D - for bone health  Put it in your pill box

## 2017-03-19 ENCOUNTER — Encounter: Payer: Self-pay | Admitting: Gastroenterology

## 2017-04-07 ENCOUNTER — Ambulatory Visit
Admission: RE | Admit: 2017-04-07 | Discharge: 2017-04-07 | Disposition: A | Discharge status: Home / Self Care | Payer: 59 | Source: Ambulatory Visit | Attending: Family Medicine | Admitting: Family Medicine

## 2017-04-07 DIAGNOSIS — Z1231 Encounter for screening mammogram for malignant neoplasm of breast: Secondary | ICD-10-CM

## 2017-04-16 ENCOUNTER — Ambulatory Visit (INDEPENDENT_AMBULATORY_CARE_PROVIDER_SITE_OTHER)
Admission: RE | Admit: 2017-04-16 | Discharge: 2017-04-16 | Disposition: A | Discharge status: Home / Self Care | Payer: 59 | Source: Ambulatory Visit | Attending: Family Medicine | Admitting: Family Medicine

## 2017-04-16 DIAGNOSIS — E2839 Other primary ovarian failure: Secondary | ICD-10-CM | POA: Diagnosis not present

## 2017-05-04 ENCOUNTER — Ambulatory Visit (AMBULATORY_SURGERY_CENTER): Payer: Self-pay | Admitting: *Deleted

## 2017-05-04 VITALS — Ht 58.25 in | Wt 163.0 lb

## 2017-05-04 DIAGNOSIS — Z1211 Encounter for screening for malignant neoplasm of colon: Secondary | ICD-10-CM

## 2017-05-04 MED ORDER — NA SULFATE-K SULFATE-MG SULF 17.5-3.13-1.6 GM/177ML PO SOLN: 1.0000 | Freq: Once | ORAL | 0 refills | Status: AC

## 2017-05-04 NOTE — Progress Notes (Signed)
No egg or soy allergy known to patient  No issues with past sedation with any surgeries  or procedures, no intubation problems  No diet pills per patient No home 02 use per patient  No blood thinners per patient  Pt denies issues with constipation  No A fib or A flutter  EMMI video sent to pt's e mail  15$ coupon to pt for suprep

## 2017-05-05 ENCOUNTER — Encounter: Payer: Self-pay | Admitting: Gastroenterology

## 2017-05-18 ENCOUNTER — Encounter: Payer: Self-pay | Admitting: Gastroenterology

## 2017-05-18 ENCOUNTER — Ambulatory Visit (AMBULATORY_SURGERY_CENTER): Payer: 59 | Admitting: Gastroenterology

## 2017-05-18 VITALS — BP 133/46 | HR 75 | Temp 98.6°F | Resp 20 | Ht 58.25 in | Wt 163.0 lb

## 2017-05-18 DIAGNOSIS — K635 Polyp of colon: Secondary | ICD-10-CM

## 2017-05-18 DIAGNOSIS — Z1211 Encounter for screening for malignant neoplasm of colon: Secondary | ICD-10-CM | POA: Diagnosis not present

## 2017-05-18 DIAGNOSIS — D125 Benign neoplasm of sigmoid colon: Secondary | ICD-10-CM

## 2017-05-18 DIAGNOSIS — D128 Benign neoplasm of rectum: Secondary | ICD-10-CM

## 2017-05-18 DIAGNOSIS — K6289 Other specified diseases of anus and rectum: Secondary | ICD-10-CM

## 2017-05-18 DIAGNOSIS — Z1212 Encounter for screening for malignant neoplasm of rectum: Secondary | ICD-10-CM

## 2017-05-18 DIAGNOSIS — K6389 Other specified diseases of intestine: Secondary | ICD-10-CM

## 2017-05-18 DIAGNOSIS — D129 Benign neoplasm of anus and anal canal: Secondary | ICD-10-CM

## 2017-05-18 MED ORDER — SODIUM CHLORIDE 0.9 % IV SOLN: 500.0000 mL | INTRAVENOUS | Status: DC

## 2017-05-18 NOTE — Progress Notes (Signed)
Called to room to assist during endoscopic procedure.  Patient ID and intended procedure confirmed with present staff. Received instructions for my participation in the procedure from the performing physician.  

## 2017-05-18 NOTE — Patient Instructions (Signed)
YOU HAD AN ENDOSCOPIC PROCEDURE TODAY AT [facility]:   Refer to the procedure report that was given to you for any specific questions about what was found during the examination.  If the procedure report does not answer your questions, please call your gastroenterologist to clarify.  If you requested that your care partner not be given the details of your procedure findings, then the procedure report has been included in a sealed envelope for you to review at your convenience later.  YOU SHOULD EXPECT: Some feelings of bloating in the abdomen. Passage of more gas than usual.  Walking can help get rid of the air that was put into your GI tract during the procedure and reduce the bloating. If you had a lower endoscopy (such as a colonoscopy or flexible sigmoidoscopy) you may notice spotting of blood in your stool or on the toilet paper. If you underwent a bowel prep for your procedure, you may not have a normal bowel movement for a few days.  Please Note:  You might notice some irritation and congestion in your nose or some drainage.  This is from the oxygen used during your procedure.  There is no need for concern and it should clear up in a day or so.  SYMPTOMS TO REPORT IMMEDIATELY:   Following lower endoscopy (colonoscopy or flexible sigmoidoscopy):  Excessive amounts of blood in the stool  Significant tenderness or worsening of abdominal pains  Swelling of the abdomen that is new, acute  Fever of 100F or higher   For urgent or emergent issues, a gastroenterologist can be reached at any hour by calling [phone].   DIET:  We do recommend a small meal at first, but then you may proceed to your regular diet.  Drink plenty of fluids but you should avoid alcoholic beverages for 24 hours.  ACTIVITY:  You should plan to take it easy for the rest of today and you should NOT DRIVE or use heavy machinery until tomorrow (because of the sedation medicines used during the test).     FOLLOW UP: Our staff will call the number listed on your records the next business day following your procedure to check on you and address any questions or concerns that you may have regarding the information given to you following your procedure. If we do not reach you, we will leave a message.  However, if you are feeling well and you are not experiencing any problems, there is no need to return our call.  We will assume that you have returned to your regular daily activities without incident.  If any biopsies were taken you will be contacted by phone or by letter within the next 1-3 weeks.  Please call us at [phone] if you have not heard about the biopsies in 3 weeks.   Polyps (handout given) Hemorrhoids (handout given) Await for biopsy results to determine next repeat Colonoscopy screening   SIGNATURES/CONFIDENTIALITY: You and/or your care partner have signed paperwork which will be entered into your electronic medical record.  These signatures attest to the fact that that the information above on your After Visit Summary has been reviewed and is understood.  Full responsibility of the confidentiality of this discharge information lies with you and/or your care-partner.

## 2017-05-18 NOTE — Op Note (Signed)
[facility] Patient Name: Gabriella Santiago Procedure Date: 05/18/2017 8:02 AM MRN: [id number] Endoscopist: Mauri Pole , MD Age: 66 Referring MD:  Date of Birth: [birth year] Gender: Female Account #: [id number] Procedure:                Colonoscopy Indications:              Screening for colorectal malignant neoplasm Medicines:                Monitored Anesthesia Care Procedure:                Pre-Anesthesia Assessment:                           - Prior to the procedure, a History and Physical                            was performed, and patient medications and                            allergies were reviewed. The patient's tolerance of                            previous anesthesia was also reviewed. The risks                            and benefits of the procedure and the sedation                            options and risks were discussed with the patient.                            All questions were answered, and informed consent                            was obtained. Prior Anticoagulants: The patient has                            taken no previous anticoagulant or antiplatelet                            agents. ASA Grade Assessment: II - A patient with                            mild systemic disease. After reviewing the risks                            and benefits, the patient was deemed in                            satisfactory condition to undergo the procedure.                           After obtaining informed consent, the colonoscope  was passed under direct vision. Throughout the                            procedure, the patient's blood pressure, pulse, and                            oxygen saturations were monitored continuously. The                            Model CF-HQ190L [id number] scope was introduced                            through the anus and advanced to the the terminal                            ileum, with  identification of the appendiceal                            orifice and IC valve. The colonoscopy was performed                            without difficulty. The patient tolerated the                            procedure well. The quality of the bowel                            preparation was excellent. The terminal ileum,                            ileocecal valve, appendiceal orifice, and rectum                            were photographed. Scope In: 8:16:52 AM Scope Out: 8:30:21 AM Scope Withdrawal Time: 0 hours 10 minutes 5 seconds  Total Procedure Duration: 0 hours 13 minutes 29 seconds  Findings:                 The perianal and digital rectal examinations were                            normal.                           Two sessile polyps were found in the rectum and                            sigmoid colon. The polyps were 1 to 2 mm in size.                            These polyps were removed with a cold biopsy                            forceps. Resection and retrieval were complete.  Non-bleeding internal hemorrhoids were found during                            retroflexion. The hemorrhoids were small.                           The exam was otherwise without abnormality. Complications:            No immediate complications. Estimated Blood Loss:     Estimated blood loss was minimal. Impression:               - Two 1 to 2 mm polyps in the rectum and in the                            sigmoid colon, removed with a cold biopsy forceps.                            Resected and retrieved.                           - Non-bleeding internal hemorrhoids.                           - The examination was otherwise normal. Recommendation:           - Patient has a contact number available for                            emergencies. The signs and symptoms of potential                            delayed complications were discussed with the                             patient. Return to normal activities tomorrow.                            Written discharge instructions were provided to the                            patient.                           - Resume previous diet.                           - Continue present medications.                           - Await pathology results.                           - Repeat colonoscopy in 5-10 years for surveillance                            based on pathology results. Mauri Pole, MD 05/18/2017 8:35:52 AM This report has been  signed electronically.

## 2017-05-18 NOTE — Progress Notes (Signed)
Pt's states no medical or surgical changes since previsit or office visit. 

## 2017-05-18 NOTE — Progress Notes (Signed)
To recovery, report to Jones, RN, VSS 

## 2017-05-19 ENCOUNTER — Telehealth: Payer: Self-pay | Admitting: *Deleted

## 2017-05-19 NOTE — Telephone Encounter (Signed)
  Follow up Call-  Call back number 05/18/2017  Post procedure Call Back phone  # [phone]  Permission to leave phone message Yes  Some recent data might be hidden     Patient questions:  Do you have a fever, pain , or abdominal swelling? No. Pain Score  0 *  Have you tolerated food without any problems? Yes.    Have you been able to return to your normal activities? Yes.    Do you have any questions about your discharge instructions: Diet   No. Medications  No. Follow up visit  No.  Do you have questions or concerns about your Care? No.  Actions: * If pain score is 4 or above: No action needed, pain <4.

## 2017-05-22 ENCOUNTER — Encounter: Payer: Self-pay | Admitting: Gastroenterology

## 2018-01-16 DIAGNOSIS — Z23 Encounter for immunization: Secondary | ICD-10-CM | POA: Diagnosis not present

## 2018-03-08 ENCOUNTER — Telehealth: Payer: Self-pay | Admitting: Family Medicine

## 2018-03-08 DIAGNOSIS — I10 Essential (primary) hypertension: Secondary | ICD-10-CM

## 2018-03-08 DIAGNOSIS — E785 Hyperlipidemia, unspecified: Secondary | ICD-10-CM

## 2018-03-08 DIAGNOSIS — M810 Age-related osteoporosis without current pathological fracture: Secondary | ICD-10-CM

## 2018-03-08 NOTE — Telephone Encounter (Signed)
-----   Message from Ellamae Sia sent at 03/08/2018  4:08 PM EDT ----- Regarding: Lab orders for Thursday, 4.18.19 Patient is scheduled for CPX labs, please order future labs, Thanks , Karna Christmas

## 2018-03-18 ENCOUNTER — Other Ambulatory Visit (INDEPENDENT_AMBULATORY_CARE_PROVIDER_SITE_OTHER): Payer: Medicare Other

## 2018-03-18 DIAGNOSIS — E785 Hyperlipidemia, unspecified: Secondary | ICD-10-CM

## 2018-03-18 DIAGNOSIS — M810 Age-related osteoporosis without current pathological fracture: Secondary | ICD-10-CM

## 2018-03-18 DIAGNOSIS — I10 Essential (primary) hypertension: Secondary | ICD-10-CM | POA: Diagnosis not present

## 2018-03-18 LAB — CBC WITH DIFFERENTIAL/PLATELET
BASOS ABS: 0 10*3/uL (ref 0.0–0.1)
Basophils Relative: 0.7 % (ref 0.0–3.0)
Eosinophils Absolute: 0.1 10*3/uL (ref 0.0–0.7)
Eosinophils Relative: 1 % (ref 0.0–5.0)
HCT: 43.7 % (ref 36.0–46.0)
HEMOGLOBIN: 15.3 g/dL — AB (ref 12.0–15.0)
LYMPHS PCT: 17.3 % (ref 12.0–46.0)
Lymphs Abs: 1.2 10*3/uL (ref 0.7–4.0)
MCHC: 35 g/dL (ref 30.0–36.0)
MCV: 89.5 fl (ref 78.0–100.0)
MONOS PCT: 8.5 % (ref 3.0–12.0)
Monocytes Absolute: 0.6 10*3/uL (ref 0.1–1.0)
Neutro Abs: 5.2 10*3/uL (ref 1.4–7.7)
Neutrophils Relative %: 72.5 % (ref 43.0–77.0)
Platelets: 309 10*3/uL (ref 150.0–400.0)
RBC: 4.88 Mil/uL (ref 3.87–5.11)
RDW: 12.6 % (ref 11.5–15.5)
WBC: 7.1 10*3/uL (ref 4.0–10.5)

## 2018-03-18 LAB — LIPID PANEL
CHOL/HDL RATIO: 3
Cholesterol: 179 mg/dL (ref 0–200)
HDL: 60.9 mg/dL (ref >39.00)
LDL Cholesterol: 98 mg/dL (ref 0–99)
NonHDL: 117.75
TRIGLYCERIDES: 99 mg/dL (ref 0.0–149.0)
VLDL: 19.8 mg/dL (ref 0.0–40.0)

## 2018-03-18 LAB — TSH: TSH: 0.74 u[IU]/mL (ref 0.35–4.50)

## 2018-03-18 LAB — COMPREHENSIVE METABOLIC PANEL
ALT: 19 U/L (ref 0–35)
AST: 15 U/L (ref 0–37)
Albumin: 4.3 g/dL (ref 3.5–5.2)
Alkaline Phosphatase: 63 U/L (ref 39–117)
BILIRUBIN TOTAL: 1.2 mg/dL (ref 0.2–1.2)
BUN: 14 mg/dL (ref 6–23)
CALCIUM: 9.7 mg/dL (ref 8.4–10.5)
CO2: 29 meq/L (ref 19–32)
Chloride: 100 mEq/L (ref 96–112)
Creatinine, Ser: 0.83 mg/dL (ref 0.40–1.20)
GFR: 73.3 mL/min (ref >60.00)
Glucose, Bld: 94 mg/dL (ref 70–99)
Potassium: 3.5 mEq/L (ref 3.5–5.1)
Sodium: 137 mEq/L (ref 135–145)
Total Protein: 7.1 g/dL (ref 6.0–8.3)

## 2018-03-18 LAB — VITAMIN D 25 HYDROXY (VIT D DEFICIENCY, FRACTURES): VITD: 15.73 ng/mL — AB (ref 30.00–100.00)

## 2018-03-19 ENCOUNTER — Encounter: Payer: 59 | Admitting: Family Medicine

## 2018-03-23 ENCOUNTER — Encounter: Payer: Self-pay | Admitting: Family Medicine

## 2018-03-23 ENCOUNTER — Ambulatory Visit (INDEPENDENT_AMBULATORY_CARE_PROVIDER_SITE_OTHER): Payer: Medicare Other | Admitting: Family Medicine

## 2018-03-23 VITALS — BP 136/78 | HR 86 | Temp 98.1°F | Ht <= 58 in | Wt 158.2 lb

## 2018-03-23 DIAGNOSIS — Z6833 Body mass index (BMI) 33.0-33.9, adult: Secondary | ICD-10-CM

## 2018-03-23 DIAGNOSIS — E559 Vitamin D deficiency, unspecified: Secondary | ICD-10-CM

## 2018-03-23 DIAGNOSIS — E785 Hyperlipidemia, unspecified: Secondary | ICD-10-CM | POA: Diagnosis not present

## 2018-03-23 DIAGNOSIS — I10 Essential (primary) hypertension: Secondary | ICD-10-CM | POA: Diagnosis not present

## 2018-03-23 DIAGNOSIS — E6609 Other obesity due to excess calories: Secondary | ICD-10-CM

## 2018-03-23 DIAGNOSIS — Z1211 Encounter for screening for malignant neoplasm of colon: Secondary | ICD-10-CM | POA: Diagnosis not present

## 2018-03-23 DIAGNOSIS — M810 Age-related osteoporosis without current pathological fracture: Secondary | ICD-10-CM

## 2018-03-23 DIAGNOSIS — Z23 Encounter for immunization: Secondary | ICD-10-CM | POA: Diagnosis not present

## 2018-03-23 DIAGNOSIS — Z0000 Encounter for general adult medical examination without abnormal findings: Secondary | ICD-10-CM | POA: Insufficient documentation

## 2018-03-23 MED ORDER — LOSARTAN POTASSIUM-HCTZ 100-25 MG PO TABS: 1.0000 | ORAL_TABLET | Freq: Every day | ORAL | 11 refills | Status: DC

## 2018-03-23 MED ORDER — ERGOCALCIFEROL 1.25 MG (50000 UT) PO CAPS: 50000.0000 [IU] | ORAL_CAPSULE | ORAL | 0 refills | Status: DC

## 2018-03-23 NOTE — Patient Instructions (Addendum)
Don't forget to schedule your mammogram   prevnar vaccine today   If you are interested in the new shingles vaccine (Shingrix) - call your local pharmacy to check on coverage and availability  Get on a waiting list at your pharmacy   If you are interested in a 5 year course of evista to prevent fractures-let us know/ read the info  It is extremely important you take vit D, and calcium if you can tolerate it  Try to get 1200-1500 mg of calcium per day with at least 2000 iu of vitamin D - for bone health  We will also prescribe you a 50,000 iu dose weekly for 12 weeks   Bring a copy of your advance directive the next time you come

## 2018-03-23 NOTE — Progress Notes (Signed)
Subjective:    Patient ID: Gabriella Santiago, female    DOB: [birth year], 67 y.o.   MRN: [id number]  HPI I have personally reviewed the Medicare Annual Wellness questionnaire and have noted 1. The patient's medical and social history 2. Their use of alcohol, tobacco or illicit drugs 3. Their current medications and supplements 4. The patient's functional ability including ADL's, fall risks, home safety risks and hearing or visual             impairment. 5. Diet and physical activities 6. Evidence for depression or mood disorders  The patients weight, height, BMI have been recorded in the chart and visual acuity is per eye clinic.  I have made referrals, counseling and provided education to the patient based review of the above and I have provided the pt with a written personalized care plan for preventive services. Reviewed and updated provider list, see scanned forms.  See scanned forms.  Routine anticipatory guidance given to patient.  See health maintenance. Colon cancer screening colonoscopy 6/18 (nl polyp)- thinks 32 y recall  Breast cancer screening mammogram 5/18 normal-due next month  She will schedule her own mammogram in [place] breast exam-no lumps  Flu vaccine 2/19 Tetanus vaccine 4/09-will get at pharmacy  Pneumovax- due for prevnar Zoster vaccine-has not had yet (is interested in shingrix)  dexa 5/18 -worsened osteoporosis  Has had 5 years of fosamax in the past  Falls-none  Fractures -none  Not taking her vit D/ca --has it and misses it  D level 15.7   Pap nl 1/17 -no gyn problems at all /no hx of abn paps  Advance directive - does have a living will and poa (sister Tonia Brooms)  Cognitive function addressed- see scanned forms- and if abnormal then additional documentation follows. 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    Hearing Screening   125Hz  250Hz  500Hz  1000Hz  2000Hz  3000Hz  4000Hz  6000Hz  8000Hz   Right ear:   40 40 40  40    Left ear:   40 40 40  40    Vision Screening Comments: Pt had a yearly eye exam with Dr. Matilde Sprang in [place], [place]    ROS: See HPI.  Otherwise negative.    Wt Readings from Last 3 Encounters:  03/23/18 158 lb 4 oz (71.8 kg)  05/18/17 163 lb (73.9 kg)  05/04/17 163 lb (73.9 kg)  still working on it  Not a lot of time for self care  Walks  Has a gym membership  Still working  33.07 kg/m   Vision exam utd  Hearing exam-passed   bp is stable today  No cp or palpitations or headaches or edema  No side effects to medicines  BP Readings from Last 3 Encounters:  03/23/18 136/78  05/18/17 (!) 133/46  03/17/17 126/72     Lab Results  Component Value Date   CREATININE 0.83 03/18/2018   BUN 14 03/18/2018   NA 137 03/18/2018   K 3.5 03/18/2018   CL 100 03/18/2018   CO2 29 03/18/2018   Lab Results  Component Value Date   ALT 19 03/18/2018   AST 15 03/18/2018   ALKPHOS 63 03/18/2018   BILITOT 1.2 03/18/2018    Lab Results  Component Value Date   WBC 7.1 03/18/2018  HGB 15.3 (H) 03/18/2018   HCT 43.7 03/18/2018   MCV 89.5 03/18/2018   PLT 309.0 03/18/2018    Glucose 94   Hyperlipidemia Lab Results  Component Value Date   CHOL 179 03/18/2018   CHOL 196 03/12/2017   CHOL 197 12/14/2015   Lab Results  Component Value Date   HDL 60.90 03/18/2018   HDL 58.20 03/12/2017   HDL 59.50 12/14/2015   Lab Results  Component Value Date   LDLCALC 98 03/18/2018   LDLCALC 121 (H) 03/12/2017   LDLCALC 121 (H) 12/14/2015   Lab Results  Component Value Date   TRIG 99.0 03/18/2018   TRIG 85.0 03/12/2017   TRIG 83.0 12/14/2015   Lab Results  Component Value Date   CHOLHDL 3 03/18/2018   CHOLHDL 3 03/12/2017   CHOLHDL 3 12/14/2015   Lab Results  Component Value Date   LDLDIRECT 116.8 01/24/2013    LDL is down  Eating healthy !     Lab Results  Component Value Date   TSH 0.74 03/18/2018     Patient Active Problem List   Diagnosis Date Noted  . Welcome to Medicare preventive visit 03/23/2018  . Vitamin D deficiency 03/23/2018  . Screening mammogram, encounter for 03/17/2017  . Estrogen deficiency 03/17/2017  . Obesity 05/11/2015  . Encounter for routine gynecological examination 01/31/2013  . Colon cancer screening 01/31/2013  . Routine general medical examination at a health care facility 01/23/2013  . STRESS REACTION, ACUTE, WITH EMOTIONAL DISTURBANCE 06/06/2009  . Hyperlipidemia, mild 03/01/2008  . Essential hypertension 03/01/2008  . GERD 03/01/2008  . FIBROCYSTIC BREAST DISEASE 03/01/2008  . POSTMENOPAUSAL STATUS 03/01/2008  . Osteoporosis 03/01/2008  . URINARY HESITANCY 03/01/2008   Past Medical History:  Diagnosis Date  . Allergy   . Anemia    years ago   . Hypertension    Past Surgical History:  Procedure Laterality Date  . TUBAL LIGATION     Social History   Tobacco Use  . Smoking status: Never Smoker  . Smokeless tobacco: Never Used  Substance Use Topics  . Alcohol use: No    Alcohol/week: 0.0 oz  . Drug use: No   Family History  Problem Relation Age of Onset  . Breast cancer Cousin   . Colon cancer Neg Hx   . Colon polyps Neg Hx   . Esophageal cancer Neg Hx   . Rectal cancer Neg Hx   . Stomach cancer Neg Hx    Allergies  Allergen Reactions  . Shellfish Allergy Rash   No current outpatient medications on file prior to visit.   Current Facility-Administered Medications on File Prior to Visit  Medication Dose Route Frequency Provider Last Rate Last Dose  . 0.9 %  sodium chloride infusion  500 mL Intravenous Continuous Nandigam, Venia Minks, MD        Review of Systems  Constitutional: Positive for fatigue. Negative for activity change, appetite change, fever and unexpected weight change.       Fatigue from schedule  HENT: Negative for congestion, ear pain, rhinorrhea,  sinus pressure and sore throat.   Eyes: Negative for pain, redness and visual disturbance.  Respiratory: Negative for cough, shortness of breath and wheezing.   Cardiovascular: Negative for chest pain and palpitations.  Gastrointestinal: Negative for abdominal pain, blood in stool, constipation and diarrhea.  Endocrine: Negative for polydipsia and polyuria.  Genitourinary: Negative for dysuria, frequency and urgency.  Musculoskeletal: Negative for arthralgias, back pain and myalgias.  Skin: Negative for  pallor and rash.  Allergic/Immunologic: Negative for environmental allergies.  Neurological: Negative for dizziness, syncope and headaches.  Hematological: Negative for adenopathy. Does not bruise/bleed easily.  Psychiatric/Behavioral: Negative for decreased concentration and dysphoric mood. The patient is not nervous/anxious.        Stressors continue with little time for self care        Objective:   Physical Exam  Constitutional: She appears well-developed and well-nourished. No distress.  obese and well appearing   HENT:  Head: Normocephalic and atraumatic.  Right Ear: External ear normal.  Left Ear: External ear normal.  Mouth/Throat: Oropharynx is clear and moist.  Eyes: Pupils are equal, round, and reactive to light. Conjunctivae and EOM are normal. No scleral icterus.  Neck: Normal range of motion. Neck supple. No JVD present. Carotid bruit is not present. No thyromegaly present.  Cardiovascular: Normal rate, regular rhythm, normal heart sounds and intact distal pulses. Exam reveals no gallop.  Pulmonary/Chest: Effort normal and breath sounds normal. No respiratory distress. She has no wheezes. She exhibits no tenderness. No breast swelling, tenderness, discharge or bleeding.  Abdominal: Soft. Bowel sounds are normal. She exhibits no distension, no abdominal bruit and no mass. There is no tenderness.  Genitourinary: No breast swelling, tenderness, discharge or bleeding.    Genitourinary Comments: Breast exam: No mass, nodules, thickening, tenderness, bulging, retraction, inflamation, nipple discharge or skin changes noted.  No axillary or clavicular LA.      Musculoskeletal: Normal range of motion. She exhibits no edema or tenderness.  Very mild kyphosis   Lymphadenopathy:    She has no cervical adenopathy.  Neurological: She is alert. She has normal reflexes. No cranial nerve deficit. She exhibits normal muscle tone. Coordination normal.  Skin: Skin is warm and dry. No rash noted. No erythema. No pallor.  Solar lentigines diffusely  Fair complexion  Psychiatric: She has a normal mood and affect.  Pleasant           Assessment & Plan:   Problem List Items Addressed This Visit      Cardiovascular and Mediastinum   Essential hypertension    bp in fair control at this time  BP Readings from Last 1 Encounters:  03/23/18 136/78   No changes needed Disc lifstyle change with low sodium diet and exercise  Labs reviewed  Wt loss enc       Relevant Medications   losartan-hydrochlorothiazide (HYZAAR) 100-25 MG tablet     Musculoskeletal and Integument   Osteoporosis    dexa 5/18 worsened BMD No falls or fx  Finished course of fosamax in the past  Enc her to take D and Ca more regularly  D level is low-will also tx with 12 week high dose tx  fam hx of fx hip         Relevant Medications   ergocalciferol (VITAMIN D2) 50000 units capsule     Other   Colon cancer screening    Colonoscopy 6/18      Hyperlipidemia, mild    Good HDL and improved LDL Disc goals for lipids and reasons to control them Rev labs with pt Rev low sat fat diet in detail commended      Relevant Medications   losartan-hydrochlorothiazide (HYZAAR) 100-25 MG tablet   Obesity    Discussed how this problem influences overall health and the risks it imposes  Reviewed plan for weight loss with lower calorie diet (via better food choices and also portion control or  program like weight watchers)  and exercise building up to or more than 30 minutes 5 days per week including some aerobic activity         Vitamin D deficiency    Level of 15 Will tx with 12 weeks of high dose tx  Also ca and D daily-see inst  Stressed imp to bone and overall health       Welcome to Medicare preventive visit - Primary    Reviewed health habits including diet and exercise and skin cancer prevention Reviewed appropriate screening tests for age  Also reviewed health mt list, fam hx and immunization status , as well as social and family history   See HPI Labs reviewed AVS: as follows Don't forget to schedule your mammogram   prevnar vaccine today   If you are interested in the new shingles vaccine (Shingrix) - call your local pharmacy to check on coverage and availability  Get on a waiting list at your pharmacy   If you are interested in a 5 year course of evista to prevent fractures-let us know/ read the info  It is extremely important you take vit D, and calcium if you can tolerate it  Try to get 1200-1500 mg of calcium per day with at least 2000 iu of vitamin D - for bone health  We will also prescribe you a 50,000 iu dose weekly for 12 weeks   Bring a copy of your advance directive the next time you come        Other Visit Diagnoses    Need for vaccination with 13-polyvalent pneumococcal conjugate vaccine       Relevant Orders   Pneumococcal conjugate vaccine 13-valent (Completed)

## 2018-03-24 NOTE — Assessment & Plan Note (Signed)
dexa 5/18 worsened BMD No falls or fx  Finished course of fosamax in the past  Enc her to take D and Ca more regularly  D level is low-will also tx with 12 week high dose tx  fam hx of fx hip

## 2018-03-24 NOTE — Assessment & Plan Note (Signed)
Good HDL and improved LDL Disc goals for lipids and reasons to control them Rev labs with pt Rev low sat fat diet in detail commended

## 2018-03-24 NOTE — Assessment & Plan Note (Signed)
Reviewed health habits including diet and exercise and skin cancer prevention Reviewed appropriate screening tests for age  Also reviewed health mt list, fam hx and immunization status , as well as social and family history   See HPI Labs reviewed AVS: as follows Don't forget to schedule your mammogram   prevnar vaccine today   If you are interested in the new shingles vaccine (Shingrix) - call your local pharmacy to check on coverage and availability  Get on a waiting list at your pharmacy   If you are interested in a 5 year course of evista to prevent fractures-let us know/ read the info  It is extremely important you take vit D, and calcium if you can tolerate it  Try to get 1200-1500 mg of calcium per day with at least 2000 iu of vitamin D - for bone health  We will also prescribe you a 50,000 iu dose weekly for 12 weeks   Bring a copy of your advance directive the next time you come

## 2018-03-24 NOTE — Assessment & Plan Note (Signed)
bp in fair control at this time  BP Readings from Last 1 Encounters:  03/23/18 136/78   No changes needed Disc lifstyle change with low sodium diet and exercise  Labs reviewed  Wt loss enc

## 2018-03-24 NOTE — Assessment & Plan Note (Signed)
Level of 15 Will tx with 12 weeks of high dose tx  Also ca and D daily-see inst  Stressed imp to bone and overall health

## 2018-03-24 NOTE — Assessment & Plan Note (Signed)
Discussed how this problem influences overall health and the risks it imposes  Reviewed plan for weight loss with lower calorie diet (via better food choices and also portion control or program like weight watchers) and exercise building up to or more than 30 minutes 5 days per week including some aerobic activity    

## 2018-03-24 NOTE — Assessment & Plan Note (Signed)
Colonoscopy 6/18

## 2019-03-26 ENCOUNTER — Other Ambulatory Visit: Payer: Self-pay | Admitting: Family Medicine

## 2019-04-01 ENCOUNTER — Ambulatory Visit (INDEPENDENT_AMBULATORY_CARE_PROVIDER_SITE_OTHER): Payer: Medicare Other

## 2019-04-01 DIAGNOSIS — Z0000 Encounter for general adult medical examination without abnormal findings: Secondary | ICD-10-CM | POA: Diagnosis not present

## 2019-04-01 NOTE — Patient Instructions (Signed)
Gabriella Santiago , Thank you for taking time to come for your Medicare Wellness Visit. I appreciate your ongoing commitment to your health goals. Please review the following plan we discussed and let me know if I can assist you in the future.   These are the goals we discussed: Goals    . Increase physical activity     Starting 04/01/2019, I will continue to walk at least 30 minutes 2-3 days per week.        This is a list of the screening recommended for you and due dates:  Health Maintenance  Topic Date Due  . Pneumonia vaccines (2 of 2 - PPSV23) 03/31/2020*  . Tetanus Vaccine  11/30/2020*  .  Hepatitis C: One time screening is recommended by Center for Disease Control  (CDC) for  adults born from [birth year] through 1963.   01/02/2021*  . Mammogram  04/08/2019  . Flu Shot  07/02/2019  . Colon Cancer Screening  05/19/2027  . DEXA scan (bone density measurement)  Completed  *Topic was postponed. The date shown is not the original due date.   Preventive Care for Adults  A healthy lifestyle and preventive care can promote health and wellness. Preventive health guidelines for adults include the following key practices.  . A routine yearly physical is a good way to check with your health care provider about your health and preventive screening. It is a chance to share any concerns and updates on your health and to receive a thorough exam.  . Visit your dentist for a routine exam and preventive care every 6 months. Brush your teeth twice a day and floss once a day. Good oral hygiene prevents tooth decay and gum disease.  . The frequency of eye exams is based on your age, health, family medical history, use  of contact lenses, and other factors. Follow your health care provider's recommendations for frequency of eye exams.  . Eat a healthy diet. Foods like vegetables, fruits, whole grains, low-fat dairy products, and lean protein foods contain the nutrients you need without too many calories. Decrease  your intake of foods high in solid fats, added sugars, and salt. Eat the right amount of calories for you. Get information about a proper diet from your health care provider, if necessary.  . Regular physical exercise is one of the most important things you can do for your health. Most adults should get at least 150 minutes of moderate-intensity exercise (any activity that increases your heart rate and causes you to sweat) each week. In addition, most adults need muscle-strengthening exercises on 2 or more days a week.  Silver Sneakers may be a benefit available to you. To determine eligibility, you may visit the website: www.silversneakers.com or contact program at 864-064-1095 Mon-Fri between 8AM-8PM.   . Maintain a healthy weight. The body mass index (BMI) is a screening tool to identify possible weight problems. It provides an estimate of body fat based on height and weight. Your health care provider can find your BMI and can help you achieve or maintain a healthy weight.   For adults 20 years and older: ? A BMI below 18.5 is considered underweight. ? A BMI of 18.5 to 24.9 is normal. ? A BMI of 25 to 29.9 is considered overweight. ? A BMI of 30 and above is considered obese.   . Maintain normal blood lipids and cholesterol levels by exercising and minimizing your intake of saturated fat. Eat a balanced diet with plenty of fruit and  vegetables. Blood tests for lipids and cholesterol should begin at age 25 and be repeated every 5 years. If your lipid or cholesterol levels are high, you are over 50, or you are at high risk for heart disease, you may need your cholesterol levels checked more frequently. Ongoing high lipid and cholesterol levels should be treated with medicines if diet and exercise are not working.  . If you smoke, find out from your health care provider how to quit. If you do not use tobacco, please do not start.  . If you choose to drink alcohol, please do not consume more than  2 drinks per day. One drink is considered to be 12 ounces (355 mL) of beer, 5 ounces (148 mL) of wine, or 1.5 ounces (44 mL) of liquor.  . If you are 29-84 years old, ask your health care provider if you should take aspirin to prevent strokes.  . Use sunscreen. Apply sunscreen liberally and repeatedly throughout the day. You should seek shade when your shadow is shorter than you. Protect yourself by wearing long sleeves, pants, a wide-brimmed hat, and sunglasses year round, whenever you are outdoors.  . Once a month, do a whole body skin exam, using a mirror to look at the skin on your back. Tell your health care provider of new moles, moles that have irregular borders, moles that are larger than a pencil eraser, or moles that have changed in shape or color.

## 2019-04-01 NOTE — Progress Notes (Signed)
Subjective:   Gabriella Santiago is a 68 y.o. female who presents for an Initial Medicare Annual Wellness Visit.  Review of Systems    N/A  Cardiac Risk Factors include: advanced age (>58men, >48 women);hypertension;dyslipidemia     Objective:    Today's Vitals   04/01/19 0907  PainSc: 0-No pain   There is no height or weight on file to calculate BMI.  Advanced Directives 04/01/2019 05/04/2017  Does Patient Have a Medical Advance Directive? Yes Yes  Type of Paramedic of [place];Living will [place];Living will  Copy of [place] in Chart? No - copy requested -    Current Medications (verified) Outpatient Encounter Medications as of 04/01/2019  Medication Sig  . ergocalciferol (VITAMIN D2) 50000 units capsule Take 1 capsule (50,000 Units total) by mouth once a week.  . losartan-hydrochlorothiazide (HYZAAR) 100-25 MG tablet TAKE 1 TABLET BY MOUTH EVERY DAY   Facility-Administered Encounter Medications as of 04/01/2019  Medication  . 0.9 %  sodium chloride infusion    Allergies (verified) Shellfish allergy   History: Past Medical History:  Diagnosis Date  . Allergy   . Anemia    years ago   . Hypertension    Past Surgical History:  Procedure Laterality Date  . TUBAL LIGATION     Family History  Problem Relation Age of Onset  . Breast cancer Cousin   . Colon cancer Neg Hx   . Colon polyps Neg Hx   . Esophageal cancer Neg Hx   . Rectal cancer Neg Hx   . Stomach cancer Neg Hx    Social History   Socioeconomic History  . Marital status: Single    Spouse name: Not on file  . Number of children: Not on file  . Years of education: Not on file  . Highest education level: Not on file  Occupational History  . Not on file  Social Needs  . Financial resource strain: Not on file  . Food insecurity:    Worry: Not on file    Inability: Not on file  . Transportation needs:    Medical: Not on file   Non-medical: Not on file  Tobacco Use  . Smoking status: Never Smoker  . Smokeless tobacco: Never Used  Substance and Sexual Activity  . Alcohol use: No    Alcohol/week: 0.0 standard drinks  . Drug use: No  . Sexual activity: Not on file  Lifestyle  . Physical activity:    Days per week: Not on file    Minutes per session: Not on file  . Stress: Not on file  Relationships  . Social connections:    Talks on phone: Not on file    Gets together: Not on file    Attends religious service: Not on file    Active member of club or organization: Not on file    Attends meetings of clubs or organizations: Not on file    Relationship status: Not on file  Other Topics Concern  . Not on file  Social History Narrative  . Not on file    Tobacco Counseling Counseling given: No   Clinical Intake:  Pre-visit preparation completed: Yes  Pain : No/denies pain Pain Score: 0-No pain     Nutritional Status: BMI 25 -29 Overweight Nutritional Risks: None Diabetes: No  How often do you need to have someone help you when you read instructions, pamphlets, or other written materials from your doctor or pharmacy?:  1 - Never What is the last grade level you completed in school?: 12th grade  Interpreter Needed?: No  Comments: pt is a widow Information entered by :: LPinson, LPN   Activities of Daily Living In your present state of health, do you have any difficulty performing the following activities: 04/01/2019  Hearing? N  Vision? N  Difficulty concentrating or making decisions? N  Walking or climbing stairs? N  Dressing or bathing? N  Doing errands, shopping? N  Preparing Food and eating ? N  Using the Toilet? N  In the past six months, have you accidently leaked urine? N  Do you have problems with loss of bowel control? N  Managing your Medications? N  Managing your Finances? N  Housekeeping or managing your Housekeeping? N  Some recent data might be hidden     Immunizations  and Health Maintenance Immunization History  Administered Date(s) Administered  . Influenza Inj Mdck Quad Pf 01/16/2018  . Influenza Whole 12/01/1998  . Influenza,inj,Quad PF,6+ Mos 10/01/2018  . Influenza-Unspecified 10/16/2015  . Pneumococcal Conjugate-13 03/23/2018  . Td 07/25/1998, 03/01/2008   There are no preventive care reminders to display for this patient.  Patient Care Team: Tower, Wynelle Fanny, MD as PCP - General  Indicate any recent Medical Services you may have received from other than [facility] providers in the past year (date may be approximate).     Assessment:   This is a routine wellness examination for Gabriella Santiago.  Hearing/Vision screen Vision Screening Comments: Vision exam every 2 hours/Keith Nice  Dietary issues and exercise activities discussed: Current Exercise Habits: Home exercise routine, Type of exercise: walking, Time (Minutes): 30, Intensity: Mild, Exercise limited by: None identified  Goals    . Increase physical activity     Starting 04/01/2019, I will continue to walk at least 30 minutes 2-3 days per week.       Depression Screen PHQ 2/9 Scores 04/01/2019 03/23/2018 01/31/2013  PHQ - 2 Score 0 0 0  PHQ- 9 Score 0 - -    Fall Risk Fall Risk  04/01/2019 03/23/2018  Falls in the past year? 0 No   Cognitive Function: MMSE - Mini Mental State Exam 04/01/2019  Orientation to time 5  Orientation to Place 5  Registration 3  Attention/ Calculation 0  Recall 3  Language- name 2 objects 0  Language- repeat 1  Language- follow 3 step command 0  Language- read & follow direction 0  Write a sentence 0  Copy design 0  Total score 17   PLEASE NOTE: A Mini-Cog screen was completed. Maximum score is 17. A value of 0 denotes this part of [place] MMSE was not completed or the patient failed this part of the Mini-Cog screening.   Mini-Cog Screening Orientation to Time - Max 5 pts Orientation to Place - Max 5 pts Registration - Max 3 pts Recall - Max 3 pts Language  Repeat - Max 1 pts  Screening Tests Health Maintenance  Topic Date Due  . PNA vac Low Risk Adult (2 of 2 - PPSV23) 03/31/2020 (Originally 03/24/2019)  . TETANUS/TDAP  11/30/2020 (Originally 03/01/2018)  . Hepatitis C Screening  01/02/2021 (Originally 06-Apr-1951)  . MAMMOGRAM  04/08/2019  . INFLUENZA VACCINE  07/02/2019  . COLONOSCOPY  05/19/2027  . DEXA SCAN  Completed     Plan:     I have personally reviewed, addressed, and noted the following in the patient's chart:  A. Medical and social history B. Use of alcohol, tobacco  or illicit drugs  C. Current medications and supplements D. Functional ability and status E.  Nutritional status F.  Physical activity G. Advance directives H. List of other physicians I.  Hospitalizations, surgeries, and ER visits in previous 12 months J.  Vitals (unless it is a telemedicine encounter) K. Screenings to include cognitive, depression, hearing, vision (NOTE: hearing and vision screenings not completed in telemedicine encounter) L. Referrals and appointments   In addition, I have reviewed and discussed with patient certain preventive protocols, quality metrics, and best practice recommendations. A written personalized care plan for preventive services and recommendations were provided to patient.  With patient's permission, we connected on 04/01/19 at  9:00 AM EDT by a video enabled telemedicine application. Two patient identifiers were used to ensure the encounter occurred with the correct person.    Patient was in home and writer was in office.   Signed,   Lindell Noe, MHA, BS, LPN Health Coach

## 2019-04-01 NOTE — Progress Notes (Signed)
PCP notes:   Health maintenance:  PPSV23 - postponed Tetanus - postponed  Abnormal screenings:   None  Patient concerns:   None  Nurse concerns:  None  Next PCP appt:   06/07/19 @ 0352  I reviewed health advisor's note, was available for consultation, and agree with documentation and plan. Loura Pardon MD

## 2019-04-04 ENCOUNTER — Encounter: Payer: PRIVATE HEALTH INSURANCE | Admitting: Family Medicine

## 2019-04-06 ENCOUNTER — Encounter: Payer: PRIVATE HEALTH INSURANCE | Admitting: Family Medicine

## 2019-04-08 ENCOUNTER — Encounter: Payer: PRIVATE HEALTH INSURANCE | Admitting: Family Medicine

## 2019-05-12 DIAGNOSIS — D2270 Melanocytic nevi of unspecified lower limb, including hip: Secondary | ICD-10-CM | POA: Diagnosis not present

## 2019-05-12 DIAGNOSIS — L814 Other melanin hyperpigmentation: Secondary | ICD-10-CM | POA: Diagnosis not present

## 2019-05-12 DIAGNOSIS — D2260 Melanocytic nevi of unspecified upper limb, including shoulder: Secondary | ICD-10-CM | POA: Diagnosis not present

## 2019-05-12 DIAGNOSIS — D225 Melanocytic nevi of trunk: Secondary | ICD-10-CM | POA: Diagnosis not present

## 2019-05-12 DIAGNOSIS — Z1283 Encounter for screening for malignant neoplasm of skin: Secondary | ICD-10-CM | POA: Diagnosis not present

## 2019-05-12 DIAGNOSIS — L570 Actinic keratosis: Secondary | ICD-10-CM | POA: Diagnosis not present

## 2019-05-12 DIAGNOSIS — D1800 Hemangioma unspecified site: Secondary | ICD-10-CM | POA: Diagnosis not present

## 2019-05-12 DIAGNOSIS — L821 Other seborrheic keratosis: Secondary | ICD-10-CM | POA: Diagnosis not present

## 2019-05-12 DIAGNOSIS — D2230 Melanocytic nevi of unspecified part of face: Secondary | ICD-10-CM | POA: Diagnosis not present

## 2019-05-12 DIAGNOSIS — D2371 Other benign neoplasm of skin of right lower limb, including hip: Secondary | ICD-10-CM | POA: Diagnosis not present

## 2019-05-20 ENCOUNTER — Other Ambulatory Visit: Payer: PRIVATE HEALTH INSURANCE

## 2019-05-25 ENCOUNTER — Encounter: Payer: PRIVATE HEALTH INSURANCE | Admitting: Family Medicine

## 2019-05-26 ENCOUNTER — Telehealth: Payer: Self-pay

## 2019-05-26 NOTE — Telephone Encounter (Signed)
Left detailed VM w COVID screen and back door lab info   

## 2019-05-29 ENCOUNTER — Telehealth: Payer: Self-pay | Admitting: Family Medicine

## 2019-05-29 DIAGNOSIS — I10 Essential (primary) hypertension: Secondary | ICD-10-CM

## 2019-05-29 DIAGNOSIS — M810 Age-related osteoporosis without current pathological fracture: Secondary | ICD-10-CM

## 2019-05-29 DIAGNOSIS — Z0000 Encounter for general adult medical examination without abnormal findings: Secondary | ICD-10-CM

## 2019-05-29 DIAGNOSIS — E785 Hyperlipidemia, unspecified: Secondary | ICD-10-CM

## 2019-05-29 DIAGNOSIS — E559 Vitamin D deficiency, unspecified: Secondary | ICD-10-CM

## 2019-05-29 NOTE — Telephone Encounter (Signed)
-----   Message from Ellamae Sia sent at 05/25/2019  3:35 PM EDT ----- Regarding: Lab orders for Tuesday, 6.30.20 Patient is scheduled for CPX labs, please order future labs, Thanks , Karna Christmas

## 2019-05-31 ENCOUNTER — Other Ambulatory Visit (INDEPENDENT_AMBULATORY_CARE_PROVIDER_SITE_OTHER): Payer: Medicare Other

## 2019-05-31 ENCOUNTER — Other Ambulatory Visit: Payer: Self-pay

## 2019-05-31 DIAGNOSIS — E559 Vitamin D deficiency, unspecified: Secondary | ICD-10-CM

## 2019-05-31 DIAGNOSIS — E785 Hyperlipidemia, unspecified: Secondary | ICD-10-CM

## 2019-05-31 DIAGNOSIS — I10 Essential (primary) hypertension: Secondary | ICD-10-CM

## 2019-05-31 LAB — CBC WITH DIFFERENTIAL/PLATELET
Basophils Absolute: 0 10*3/uL (ref 0.0–0.1)
Basophils Relative: 1.1 % (ref 0.0–3.0)
Eosinophils Absolute: 0.2 10*3/uL (ref 0.0–0.7)
Eosinophils Relative: 3.5 % (ref 0.0–5.0)
HCT: 42.5 % (ref 36.0–46.0)
Hemoglobin: 14.4 g/dL (ref 12.0–15.0)
Lymphocytes Relative: 35.1 % (ref 12.0–46.0)
Lymphs Abs: 1.6 10*3/uL (ref 0.7–4.0)
MCHC: 33.9 g/dL (ref 30.0–36.0)
MCV: 91.2 fl (ref 78.0–100.0)
Monocytes Absolute: 0.3 10*3/uL (ref 0.1–1.0)
Monocytes Relative: 6.9 % (ref 3.0–12.0)
Neutro Abs: 2.4 10*3/uL (ref 1.4–7.7)
Neutrophils Relative %: 53.4 % (ref 43.0–77.0)
Platelets: 288 10*3/uL (ref 150.0–400.0)
RBC: 4.67 Mil/uL (ref 3.87–5.11)
RDW: 12.6 % (ref 11.5–15.5)
WBC: 4.5 10*3/uL (ref 4.0–10.5)

## 2019-05-31 LAB — COMPREHENSIVE METABOLIC PANEL
ALT: 20 U/L (ref 0–35)
AST: 18 U/L (ref 0–37)
Albumin: 4.1 g/dL (ref 3.5–5.2)
Alkaline Phosphatase: 63 U/L (ref 39–117)
BUN: 17 mg/dL (ref 6–23)
CO2: 30 mEq/L (ref 19–32)
Calcium: 9.2 mg/dL (ref 8.4–10.5)
Chloride: 104 mEq/L (ref 96–112)
Creatinine, Ser: 0.85 mg/dL (ref 0.40–1.20)
GFR: 66.84 mL/min (ref >60.00)
Glucose, Bld: 79 mg/dL (ref 70–99)
Potassium: 4.2 mEq/L (ref 3.5–5.1)
Sodium: 140 mEq/L (ref 135–145)
Total Bilirubin: 0.7 mg/dL (ref 0.2–1.2)
Total Protein: 6.6 g/dL (ref 6.0–8.3)

## 2019-05-31 LAB — LIPID PANEL
Cholesterol: 180 mg/dL (ref 0–200)
HDL: 61.7 mg/dL (ref >39.00)
LDL Cholesterol: 99 mg/dL (ref 0–99)
NonHDL: 118.39
Total CHOL/HDL Ratio: 3
Triglycerides: 98 mg/dL (ref 0.0–149.0)
VLDL: 19.6 mg/dL (ref 0.0–40.0)

## 2019-05-31 LAB — VITAMIN D 25 HYDROXY (VIT D DEFICIENCY, FRACTURES): VITD: 41.39 ng/mL (ref 30.00–100.00)

## 2019-05-31 LAB — TSH: TSH: 1.56 u[IU]/mL (ref 0.35–4.50)

## 2019-06-07 ENCOUNTER — Encounter: Payer: Self-pay | Admitting: Family Medicine

## 2019-06-07 ENCOUNTER — Other Ambulatory Visit: Payer: Self-pay

## 2019-06-07 ENCOUNTER — Ambulatory Visit (INDEPENDENT_AMBULATORY_CARE_PROVIDER_SITE_OTHER): Payer: Medicare Other | Admitting: Family Medicine

## 2019-06-07 VITALS — BP 126/74 | HR 82 | Temp 97.7°F | Ht <= 58 in | Wt 163.4 lb

## 2019-06-07 DIAGNOSIS — Z1231 Encounter for screening mammogram for malignant neoplasm of breast: Secondary | ICD-10-CM

## 2019-06-07 DIAGNOSIS — Z6833 Body mass index (BMI) 33.0-33.9, adult: Secondary | ICD-10-CM

## 2019-06-07 DIAGNOSIS — E785 Hyperlipidemia, unspecified: Secondary | ICD-10-CM

## 2019-06-07 DIAGNOSIS — M810 Age-related osteoporosis without current pathological fracture: Secondary | ICD-10-CM

## 2019-06-07 DIAGNOSIS — M545 Low back pain, unspecified: Secondary | ICD-10-CM | POA: Insufficient documentation

## 2019-06-07 DIAGNOSIS — Z23 Encounter for immunization: Secondary | ICD-10-CM

## 2019-06-07 DIAGNOSIS — E2839 Other primary ovarian failure: Secondary | ICD-10-CM | POA: Diagnosis not present

## 2019-06-07 DIAGNOSIS — I10 Essential (primary) hypertension: Secondary | ICD-10-CM | POA: Diagnosis not present

## 2019-06-07 DIAGNOSIS — E559 Vitamin D deficiency, unspecified: Secondary | ICD-10-CM | POA: Diagnosis not present

## 2019-06-07 DIAGNOSIS — E6609 Other obesity due to excess calories: Secondary | ICD-10-CM | POA: Diagnosis not present

## 2019-06-07 DIAGNOSIS — E66811 Obesity, class 1: Secondary | ICD-10-CM

## 2019-06-07 NOTE — Assessment & Plan Note (Signed)
Scheduled annual screening mammogram Nl breast exam today  Encouraged monthly self exams   

## 2019-06-07 NOTE — Assessment & Plan Note (Signed)
Discussed how this problem influences overall health and the risks it imposes  Reviewed plan for weight loss with lower calorie diet (via better food choices and also portion control or program like weight watchers) and exercise building up to or more than 30 minutes 5 days per week including some aerobic activity    

## 2019-06-07 NOTE — Assessment & Plan Note (Signed)
Disc goals for lipids and reasons to control them Rev last labs with pt Rev low sat fat diet in detail Diet controlled/fairly stable  Suspect this will improve with plant based diet

## 2019-06-07 NOTE — Assessment & Plan Note (Signed)
Level of 41 with current supplementation  Vitamin D level is therapeutic with current supplementation Disc importance of this to bone and overall health

## 2019-06-07 NOTE — Assessment & Plan Note (Signed)
dexa ordered  Will review when it returns  Suspect she would benefit from another course of alendronate  No falls/fx Disc imp of ca and D and exercise

## 2019-06-07 NOTE — Assessment & Plan Note (Signed)
bp in fair control at this time  BP Readings from Last 1 Encounters:  06/07/19 126/74   No changes needed Most recent labs reviewed  Disc lifstyle change with low sodium diet and exercise

## 2019-06-07 NOTE — Patient Instructions (Addendum)
Please get a flu shot this fall   Pneumonia vaccine today PNA 23  The office will call you to schedule bone density test and mammogram   If you are interested in the new shingles vaccine (Shingrix) - call your local pharmacy to check on coverage and availability  If affordable, get on a wait list at your pharmacy to get the vaccine.  Call us later to set up physical therapy for your back

## 2019-06-07 NOTE — Progress Notes (Signed)
Subjective:    Patient ID: Gabriella Santiago, female    DOB: 1951-04-06, 68 y.o.   MRN: [id number]  HPI Here for annual f/u of chronic medical problems  Working in her own office/ wearing a mask  No known covid exposures   Under a lot of stress-still caring for 3 people    amw 5/20 Vaccines postponed  She had a flu vaccine in oct of 2019   prevnar 4/19  Due for pna 23 -will do today    Wt Readings from Last 3 Encounters:  06/07/19 163 lb 6 oz (74.1 kg)  03/23/18 158 lb 4 oz (71.8 kg)  05/18/17 163 lb (73.9 kg)  trying to eat a plant based diet (really likes vegetables) -getting protein from beans  Trying to give up cheese  Exercise- walking as much as she can  34.15 kg/m   Zoster status -interested in vaccine if covered by insurance   Mammogram 5/18-wants to schedule Self breast exam -no lumps   Colonoscopy 6/18   dexa 5/18  Osteoporosis -worsened at that time Took 5 y course of fosamax in the past (years ago) - may be open to another course Ca and D Exercise -walking No falls or fractures  Vit D level 41.3   bp is stable today  No cp or palpitations or headaches or edema  No side effects to medicines  BP Readings from Last 3 Encounters:  06/07/19 126/74  03/23/18 136/78  05/18/17 (!) 133/46     Lab Results  Component Value Date   CREATININE 0.85 05/31/2019   BUN 17 05/31/2019   NA 140 05/31/2019   K 4.2 05/31/2019   CL 104 05/31/2019   CO2 30 05/31/2019   Lab Results  Component Value Date   ALT 20 05/31/2019   AST 18 05/31/2019   ALKPHOS 63 05/31/2019   BILITOT 0.7 05/31/2019   Lab Results  Component Value Date   WBC 4.5 05/31/2019   HGB 14.4 05/31/2019   HCT 42.5 05/31/2019   MCV 91.2 05/31/2019   PLT 288.0 05/31/2019   Lab Results  Component Value Date   TSH 1.56 05/31/2019      Hyperlipidemia Lab Results  Component Value Date   CHOL 180 05/31/2019   CHOL 179 03/18/2018   CHOL 196 03/12/2017   Lab Results  Component Value  Date   HDL 61.70 05/31/2019   HDL 60.90 03/18/2018   HDL 58.20 03/12/2017   Lab Results  Component Value Date   LDLCALC 99 05/31/2019   LDLCALC 98 03/18/2018   LDLCALC 121 (H) 03/12/2017   Lab Results  Component Value Date   TRIG 98.0 05/31/2019   TRIG 99.0 03/18/2018   TRIG 85.0 03/12/2017   Lab Results  Component Value Date   CHOLHDL 3 05/31/2019   CHOLHDL 3 03/18/2018   CHOLHDL 3 03/12/2017   Lab Results  Component Value Date   LDLDIRECT 116.8 01/24/2013  diet controlled  Now plant based diet Overall stable   She gets massages regularly until the pandemic hit  It really helped her back  Doing some back exercises  Thinks she has arthritis  She is interested in a physiatrist (one at [place])  Or PT   Patient Active Problem List   Diagnosis Date Noted  . Low back pain 06/07/2019  . Welcome to Medicare preventive visit 03/23/2018  . Vitamin D deficiency 03/23/2018  . Screening mammogram, encounter for 03/17/2017  . Estrogen deficiency 03/17/2017  . Obesity 05/11/2015  .  Encounter for routine gynecological examination 01/31/2013  . Colon cancer screening 01/31/2013  . Routine general medical examination at a health care facility 01/23/2013  . STRESS REACTION, ACUTE, WITH EMOTIONAL DISTURBANCE 06/06/2009  . Hyperlipidemia, mild 03/01/2008  . Essential hypertension 03/01/2008  . GERD 03/01/2008  . FIBROCYSTIC BREAST DISEASE 03/01/2008  . POSTMENOPAUSAL STATUS 03/01/2008  . Osteoporosis 03/01/2008  . URINARY HESITANCY 03/01/2008   Past Medical History:  Diagnosis Date  . Allergy   . Anemia    years ago   . Hypertension    Past Surgical History:  Procedure Laterality Date  . TUBAL LIGATION     Social History   Tobacco Use  . Smoking status: Never Smoker  . Smokeless tobacco: Never Used  Substance Use Topics  . Alcohol use: No    Alcohol/week: 0.0 standard drinks  . Drug use: No   Family History  Problem Relation Age of Onset  . Breast cancer  Cousin   . Colon cancer Neg Hx   . Colon polyps Neg Hx   . Esophageal cancer Neg Hx   . Rectal cancer Neg Hx   . Stomach cancer Neg Hx    Allergies  Allergen Reactions  . Shellfish Allergy Rash   Current Outpatient Medications on File Prior to Visit  Medication Sig Dispense Refill  . Cholecalciferol (VITAMIN D3) 125 MCG (5000 UT) CAPS Take 1 capsule by mouth daily.     Marland Kitchen losartan-hydrochlorothiazide (HYZAAR) 100-25 MG tablet TAKE 1 TABLET BY MOUTH EVERY DAY 30 tablet 5   No current facility-administered medications on file prior to visit.      Review of Systems  Constitutional: Negative for activity change, appetite change, fatigue, fever and unexpected weight change.  HENT: Negative for congestion, ear pain, rhinorrhea, sinus pressure and sore throat.   Eyes: Negative for pain, redness and visual disturbance.  Respiratory: Negative for cough, shortness of breath and wheezing.   Cardiovascular: Negative for chest pain and palpitations.  Gastrointestinal: Negative for abdominal pain, blood in stool, constipation and diarrhea.  Endocrine: Negative for polydipsia and polyuria.  Genitourinary: Negative for dysuria, frequency and urgency.  Musculoskeletal: Positive for back pain. Negative for arthralgias and myalgias.  Skin: Negative for pallor and rash.  Allergic/Immunologic: Negative for environmental allergies.  Neurological: Negative for dizziness, syncope and headaches.  Hematological: Negative for adenopathy. Does not bruise/bleed easily.  Psychiatric/Behavioral: Negative for decreased concentration and dysphoric mood. The patient is not nervous/anxious.        Caregiver stress       Objective:   Physical Exam Constitutional:      General: She is not in acute distress.    Appearance: Normal appearance. She is well-developed. She is obese. She is not ill-appearing or diaphoretic.  HENT:     Head: Normocephalic and atraumatic.     Right Ear: Tympanic membrane, ear canal  and external ear normal.     Left Ear: Tympanic membrane, ear canal and external ear normal.     Nose: Nose normal.     Mouth/Throat:     Mouth: Mucous membranes are moist.     Pharynx: Oropharynx is clear. No posterior oropharyngeal erythema.  Eyes:     General: No scleral icterus.    Conjunctiva/sclera: Conjunctivae normal.     Pupils: Pupils are equal, round, and reactive to light.  Neck:     Musculoskeletal: Normal range of motion and neck supple. No neck rigidity or muscular tenderness.     Thyroid: No thyromegaly.  Vascular: No carotid bruit or JVD.  Cardiovascular:     Rate and Rhythm: Normal rate and regular rhythm.     Pulses: Normal pulses.     Heart sounds: Normal heart sounds. No gallop.   Pulmonary:     Effort: Pulmonary effort is normal. No respiratory distress.     Breath sounds: Normal breath sounds. No wheezing.  Chest:     Chest wall: No tenderness.  Abdominal:     General: Bowel sounds are normal. There is no distension or abdominal bruit.     Palpations: Abdomen is soft. There is no mass.     Tenderness: There is no abdominal tenderness. There is no right CVA tenderness or left CVA tenderness.  Genitourinary:    Comments: Breast exam: No mass, nodules, thickening, tenderness, bulging, retraction, inflamation, nipple discharge or skin changes noted.  No axillary or clavicular LA.     Musculoskeletal: Normal range of motion.        General: No tenderness.     Comments: Poor rom LS  Lymphadenopathy:     Cervical: No cervical adenopathy.  Skin:    General: Skin is warm and dry.     Coloration: Skin is not pale.     Findings: No erythema or rash.     Comments: Some light colored sks  Also lentigines  Neurological:     Mental Status: She is alert.     Cranial Nerves: No cranial nerve deficit.     Motor: No abnormal muscle tone.     Coordination: Coordination normal.     Gait: Gait normal.     Deep Tendon Reflexes: Reflexes are normal and symmetric.  Reflexes normal.  Psychiatric:        Mood and Affect: Mood normal.           Assessment & Plan:   Problem List Items Addressed This Visit      Cardiovascular and Mediastinum   Essential hypertension - Primary    bp in fair control at this time  BP Readings from Last 1 Encounters:  06/07/19 126/74   No changes needed Most recent labs reviewed  Disc lifstyle change with low sodium diet and exercise          Musculoskeletal and Integument   Osteoporosis    dexa ordered  Will review when it returns  Suspect she would benefit from another course of alendronate  No falls/fx Disc imp of ca and D and exercise       Relevant Medications   Cholecalciferol (VITAMIN D3) 125 MCG (5000 UT) CAPS     Other   Hyperlipidemia, mild    Disc goals for lipids and reasons to control them Rev last labs with pt Rev low sat fat diet in detail Diet controlled/fairly stable  Suspect this will improve with plant based diet       Obesity    Discussed how this problem influences overall health and the risks it imposes  Reviewed plan for weight loss with lower calorie diet (via better food choices and also portion control or program like weight watchers) and exercise building up to or more than 30 minutes 5 days per week including some aerobic activity         Screening mammogram, encounter for    Scheduled annual screening mammogram Nl breast exam today  Encouraged monthly self exams        Relevant Orders   MM 3D SCREEN BREAST BILATERAL   Estrogen deficiency  Relevant Orders   DG Bone Density   Vitamin D deficiency    Level of 41 with current supplementation  Vitamin D level is therapeutic with current supplementation Disc importance of this to bone and overall health        Other Visit Diagnoses    Need for 23-polyvalent pneumococcal polysaccharide vaccine       Relevant Orders   Pneumococcal polysaccharide vaccine 23-valent greater than or equal to 2yo  subcutaneous/IM (Completed)

## 2019-06-10 ENCOUNTER — Telehealth: Payer: Self-pay | Admitting: Family Medicine

## 2019-06-10 DIAGNOSIS — M545 Low back pain, unspecified: Secondary | ICD-10-CM

## 2019-06-10 DIAGNOSIS — G8929 Other chronic pain: Secondary | ICD-10-CM

## 2019-06-10 NOTE — Telephone Encounter (Signed)
Pt requests referral for physical therapy for sciatica pain and lower back strengthening. Pt would like to go once a week in [place] She hasn't been able to get the massages that have kept sciatica at [place] w/Covid pandemic.  She took Tylenol today to see if will help.  Do you recommend taking any other OTC meds?  She doesn't want to do a prescribed Rx.

## 2019-06-10 NOTE — Telephone Encounter (Signed)
Ref done I recommend tylenol Will route to [facility]

## 2019-06-10 NOTE — Telephone Encounter (Signed)
Pt notified referral done and that we only refer for the consultation and the PT will tell her how often they recommend her coming in. Also advised OTC tylenol is okay to take

## 2019-06-15 DIAGNOSIS — M545 Low back pain: Secondary | ICD-10-CM | POA: Diagnosis not present

## 2019-06-17 DIAGNOSIS — M545 Low back pain: Secondary | ICD-10-CM | POA: Diagnosis not present

## 2019-06-20 DIAGNOSIS — M545 Low back pain: Secondary | ICD-10-CM | POA: Diagnosis not present

## 2019-06-23 DIAGNOSIS — M545 Low back pain: Secondary | ICD-10-CM | POA: Diagnosis not present

## 2019-06-28 DIAGNOSIS — M545 Low back pain: Secondary | ICD-10-CM | POA: Diagnosis not present

## 2019-07-01 ENCOUNTER — Other Ambulatory Visit: Payer: Self-pay

## 2019-07-01 ENCOUNTER — Ambulatory Visit (INDEPENDENT_AMBULATORY_CARE_PROVIDER_SITE_OTHER)
Admission: RE | Admit: 2019-07-01 | Discharge: 2019-07-01 | Disposition: A | Discharge status: Home / Self Care | Payer: Medicare Other | Source: Ambulatory Visit | Attending: Family Medicine | Admitting: Family Medicine

## 2019-07-01 DIAGNOSIS — E2839 Other primary ovarian failure: Secondary | ICD-10-CM | POA: Diagnosis not present

## 2019-07-01 DIAGNOSIS — M545 Low back pain: Secondary | ICD-10-CM | POA: Diagnosis not present

## 2019-07-26 ENCOUNTER — Ambulatory Visit
Admission: RE | Admit: 2019-07-26 | Discharge: 2019-07-26 | Disposition: A | Discharge status: Home / Self Care | Payer: Medicare Other | Source: Ambulatory Visit | Attending: Family Medicine | Admitting: Family Medicine

## 2019-07-26 ENCOUNTER — Other Ambulatory Visit: Payer: Self-pay

## 2019-07-26 DIAGNOSIS — Z1231 Encounter for screening mammogram for malignant neoplasm of breast: Secondary | ICD-10-CM | POA: Diagnosis not present

## 2019-08-01 ENCOUNTER — Ambulatory Visit: Payer: PRIVATE HEALTH INSURANCE

## 2019-08-03 DIAGNOSIS — L810 Postinflammatory hyperpigmentation: Secondary | ICD-10-CM | POA: Diagnosis not present

## 2019-08-03 DIAGNOSIS — Z872 Personal history of diseases of the skin and subcutaneous tissue: Secondary | ICD-10-CM | POA: Diagnosis not present

## 2019-08-03 DIAGNOSIS — L821 Other seborrheic keratosis: Secondary | ICD-10-CM | POA: Diagnosis not present

## 2019-08-26 DIAGNOSIS — Z23 Encounter for immunization: Secondary | ICD-10-CM | POA: Diagnosis not present

## 2019-09-01 ENCOUNTER — Ambulatory Visit: Payer: PRIVATE HEALTH INSURANCE

## 2019-10-08 ENCOUNTER — Other Ambulatory Visit: Payer: Self-pay | Admitting: Family Medicine

## 2020-06-01 ENCOUNTER — Ambulatory Visit: Payer: PRIVATE HEALTH INSURANCE

## 2020-06-01 ENCOUNTER — Ambulatory Visit (INDEPENDENT_AMBULATORY_CARE_PROVIDER_SITE_OTHER): Payer: Medicare Other

## 2020-06-01 VITALS — Wt 164.0 lb

## 2020-06-01 DIAGNOSIS — Z0000 Encounter for general adult medical examination without abnormal findings: Secondary | ICD-10-CM

## 2020-06-01 NOTE — Progress Notes (Signed)
Subjective:   Gabriella Santiago is a 69 y.o. female who presents for Medicare Annual (Subsequent) preventive examination.  Review of Systems: N/A      I connected with the patient today by telephone and verified that I am speaking with the correct person using two identifiers. Location patient: home Location nurse: work Persons participating in the virtual visit: patient, Marine scientist.   I discussed the limitations, risks, security and privacy concerns of performing an evaluation and management service by telephone and the availability of in person appointments. I also discussed with the patient that there may be a patient responsible charge related to this service. The patient expressed understanding and verbally consented to this telephonic visit.    Interactive audio and video telecommunications were attempted between this nurse and patient, however failed, due to patient having technical difficulties OR patient did not have access to video capability.  We continued and completed visit with audio only.     Cardiac Risk Factors include: advanced age (>8men, >47 women);dyslipidemia;hypertension     Objective:    Today's Vitals   06/01/20 1527  Weight: 164 lb (74.4 kg)   Body mass index is 34.28 kg/m.  Advanced Directives 06/01/2020 04/01/2019 05/04/2017  Does Patient Have a Medical Advance Directive? Yes Yes Yes  Type of Paramedic of [place];Living will [place];Living will [place];Living will  Copy of [place] in Chart? No - copy requested No - copy requested -    Current Medications (verified) Outpatient Encounter Medications as of 06/01/2020  Medication Sig  . Cholecalciferol (VITAMIN D3) 125 MCG (5000 UT) CAPS Take 1 capsule by mouth daily.   Marland Kitchen losartan-hydrochlorothiazide (HYZAAR) 100-25 MG tablet TAKE 1 TABLET BY MOUTH EVERY DAY   No facility-administered encounter medications on file as of  06/01/2020.    Allergies (verified) Shellfish allergy   History: Past Medical History:  Diagnosis Date  . Allergy   . Anemia    years ago   . Hypertension    Past Surgical History:  Procedure Laterality Date  . TUBAL LIGATION     Family History  Problem Relation Age of Onset  . Breast cancer Cousin   . Colon cancer Neg Hx   . Colon polyps Neg Hx   . Esophageal cancer Neg Hx   . Rectal cancer Neg Hx   . Stomach cancer Neg Hx    Social History   Socioeconomic History  . Marital status: Single    Spouse name: Not on file  . Number of children: Not on file  . Years of education: Not on file  . Highest education level: Not on file  Occupational History  . Not on file  Tobacco Use  . Smoking status: Never Smoker  . Smokeless tobacco: Never Used  Vaping Use  . Vaping Use: Never used  Substance and Sexual Activity  . Alcohol use: No    Alcohol/week: 0.0 standard drinks  . Drug use: No  . Sexual activity: Not on file  Other Topics Concern  . Not on file  Social History Narrative  . Not on file   Social Determinants of Health   Financial Resource Strain: Low Risk   . Difficulty of Paying Living Expenses: Not hard at all  Food Insecurity: No Food Insecurity  . Worried About Charity fundraiser in the Last Year: Never true  . Ran Out of Food in the Last Year: Never true  Transportation Needs: No  Transportation Needs  . Lack of Transportation (Medical): No  . Lack of Transportation (Non-Medical): No  Physical Activity: Insufficiently Active  . Days of Exercise per Week: 7 days  . Minutes of Exercise per Session: 20 min  Stress: No Stress Concern Present  . Feeling of Stress : Not at all  Social Connections:   . Frequency of Communication with Friends and Family:   . Frequency of Social Gatherings with Friends and Family:   . Attends Religious Services:   . Active Member of Clubs or Organizations:   . Attends Archivist Meetings:   Marland Kitchen Marital Status:      Tobacco Counseling Counseling given: Not Answered   Clinical Intake:  Pre-visit preparation completed: Yes  Pain : No/denies pain     Nutritional Status: BMI > 30  Obese Nutritional Risks: None Diabetes: No  How often do you need to have someone help you when you read instructions, pamphlets, or other written materials from your doctor or pharmacy?: 1 - Never What is the last grade level you completed in school?: 12th  Diabetic: No Nutrition Risk Assessment:  Has the patient had any N/V/D within the last 2 months?  No  Does the patient have any non-healing wounds?  No  Has the patient had any unintentional weight loss or weight gain?  No   Diabetes:  Is the patient diabetic?  No  If diabetic, was a CBG obtained today?  No  Did the patient bring in their glucometer from home?  No  How often do you monitor your CBG's? N/A.   Financial Strains and Diabetes Management:  Are you having any financial strains with the device, your supplies or your medication? No .  Does the patient want to be seen by Chronic Care Management for management of their diabetes?  No  Would the patient like to be referred to a Nutritionist or for Diabetic Management?  No     Interpreter Needed?: No  Information entered by :: CJohnson, LPN   Activities of Daily Living In your present state of health, do you have any difficulty performing the following activities: 06/01/2020  Hearing? N  Vision? N  Difficulty concentrating or making decisions? N  Walking or climbing stairs? N  Dressing or bathing? N  Doing errands, shopping? N  Preparing Food and eating ? N  Using the Toilet? N  In the past six months, have you accidently leaked urine? N  Do you have problems with loss of bowel control? N  Managing your Medications? N  Managing your Finances? N  Housekeeping or managing your Housekeeping? N  Some recent data might be hidden    Patient Care Team: [place], Wynelle Fanny, MD as PCP -  General  Indicate any recent Medical Services you may have received from other than [facility] providers in the past year (date may be approximate).     Assessment:   This is a routine wellness examination for Gabriella Santiago.  Hearing/Vision screen  Hearing Screening   125Hz  250Hz  500Hz  1000Hz  2000Hz  3000Hz  4000Hz  6000Hz  8000Hz   Right ear:           Left ear:           Vision Screening Comments: Patient gets annual eye exams   Dietary issues and exercise activities discussed: Current Exercise Habits: Home exercise routine, Type of exercise: walking, Time (Minutes): 20, Frequency (Times/Week): 7, Weekly Exercise (Minutes/Week): 140, Intensity: Moderate, Exercise limited by: None identified  Goals    . Increase  physical activity     Starting 04/01/2019, I will continue to walk at least 30 minutes 2-3 days per week.     . Patient Stated     06/19/2020, I will continue to walk everyday for about 20 minutes.       Depression Screen PHQ 2/9 Scores 06/01/2020 04/01/2019 03/23/2018 01/31/2013  PHQ - 2 Score 0 0 0 0  PHQ- 9 Score 0 0 - -    Fall Risk Fall Risk  06/01/2020 04/01/2019 03/23/2018  Falls in the past year? 0 0 No  Number falls in past yr: 0 - -  Injury with Fall? 0 - -  Risk for fall due to : No Fall Risks - -  Follow up Falls evaluation completed;Falls prevention discussed - -    Any stairs in or around the home? Yes  If so, are there any without handrails? No  Home free of loose throw rugs in walkways, pet beds, electrical cords, etc? Yes  Adequate lighting in your home to reduce risk of falls? Yes   ASSISTIVE DEVICES UTILIZED TO PREVENT FALLS:  Life alert? No  Use of a cane, walker or w/c? No  Grab bars in the bathroom? No  Shower chair or bench in shower? No  Elevated toilet seat or a handicapped toilet? No   TIMED UP AND GO:  Was the test performed? N/A, telephonic visit.   Cognitive Function: MMSE - Mini Mental State Exam 06/01/2020 04/01/2019  Orientation to time 5 5    Orientation to Place 5 5  Registration 3 3  Attention/ Calculation 5 0  Recall 3 3  Language- name 2 objects - 0  Language- repeat 1 1  Language- follow 3 step command - 0  Language- read & follow direction - 0  Write a sentence - 0  Copy design - 0  Total score - 17  Mini Cog  Mini-Cog screen was completed. Maximum score is 22. A value of 0 denotes this part of the MMSE was not completed or the patient failed this part of the Mini-Cog screening.       Immunizations Immunization History  Administered Date(s) Administered  . Influenza Inj Mdck Quad Pf 01/16/2018  . Influenza Whole 12/01/1998  . Influenza, High Dose Seasonal PF 08/26/2019  . Influenza,inj,Quad PF,6+ Mos 10/01/2018  . Influenza-Unspecified 10/16/2015  . PFIZER SARS-COV-2 Vaccination 01/11/2020, 02/01/2020  . Pneumococcal Conjugate-13 03/23/2018  . Pneumococcal Polysaccharide-23 06/07/2019  . Td 07/25/1998, 03/01/2008    TDAP status: Due, Education has been provided regarding the importance of this vaccine. Advised may receive this vaccine at local pharmacy or Health Dept. Aware to provide a copy of the vaccination record if obtained from local pharmacy or Health Dept. Verbalized acceptance and understanding. Flu Vaccine status: Up to date Pneumococcal vaccine status: Up to date Covid-19 vaccine status: Completed vaccines  Qualifies for Shingles Vaccine? Yes   Zostavax completed No   Shingrix Completed?: No.    Education has been provided regarding the importance of this vaccine. Patient has been advised to call insurance company to determine out of pocket expense if they have not yet received this vaccine. Advised may also receive vaccine at local pharmacy or Health Dept. Verbalized acceptance and understanding.  Screening Tests Health Maintenance  Topic Date Due  . TETANUS/TDAP  11/30/2020 (Originally 03/01/2018)  . Hepatitis C Screening  01/02/2021 (Originally 07/22/1951)  . INFLUENZA VACCINE  07/01/2020   . MAMMOGRAM  07/25/2021  . COLONOSCOPY  05/19/2027  . DEXA SCAN  Completed  . COVID-19 Vaccine  Completed  . PNA vac Low Risk Adult  Completed    Health Maintenance  There are no preventive care reminders to display for this patient.  Colorectal cancer screening: Completed 05/18/2017. Repeat every 10 years Mammogram status: Completed 07/26/2019. Repeat every year Bone Density status: Completed 07/01/2019. Results reflect: Bone density results: OSTEOPOROSIS. Repeat every 2 years.  Lung Cancer Screening: (Low Dose CT Chest recommended if Age 89-80 years, 30 pack-year currently smoking OR have quit w/in 15years.) does not qualify.     Additional Screening:  Hepatitis C Screening: does qualify; Completed declined  Vision Screening: Recommended annual ophthalmology exams for early detection of glaucoma and other disorders of the eye. Is the patient up to date with their annual eye exam?  Yes  Who is the provider or what is the name of the office in which the patient attends annual eye exams? Dr. Matilde Sprang If pt is not established with a provider, would they like to be referred to a provider to establish care? No .   Dental Screening: Recommended annual dental exams for proper oral hygiene  Community Resource Referral / Chronic Care Management: CRR required this visit?  No   CCM required this visit?  No      Plan:     I have personally reviewed and noted the following in the patient's chart:   . Medical and social history . Use of alcohol, tobacco or illicit drugs  . Current medications and supplements . Functional ability and status . Nutritional status . Physical activity . Advanced directives . List of other physicians . Hospitalizations, surgeries, and ER visits in previous 12 months . Vitals . Screenings to include cognitive, depression, and falls . Referrals and appointments  In addition, I have reviewed and discussed with patient certain preventive protocols, quality  metrics, and best practice recommendations. A written personalized care plan for preventive services as well as general preventive health recommendations were provided to patient.   Due to this being a telephonic visit, the after visit summary with patients personalized plan was offered to patient via mail or my-chart.  Patient preferred to pick up at office at next visit.   Andrez Grime, LPN   03/04/1539

## 2020-06-01 NOTE — Progress Notes (Signed)
PCP notes:  Health Maintenance: Tdap- insurance/financial   Abnormal Screenings: none   Patient concerns: none   Nurse concerns: none   Next PCP appt.: 06/11/2020 @ 2:30 pm

## 2020-06-01 NOTE — Patient Instructions (Signed)
Gabriella Santiago , Thank you for taking time to come for your Medicare Wellness Visit. I appreciate your ongoing commitment to your health goals. Please review the following plan we discussed and let me know if I can assist you in the future.   Screening recommendations/referrals: Colonoscopy: Up to date, completed 05/18/2017, due 05/2027 Mammogram: Up to date, completed 07/26/2019, due 07/2020 Bone Density: Up to date, completed 07/01/2019, due 05/2021 Recommended yearly ophthalmology/optometry visit for glaucoma screening and checkup Recommended yearly dental visit for hygiene and checkup  Vaccinations: Influenza vaccine: Up to date, completed 08/26/2019, due 07/2020 Pneumococcal vaccine: Completed series Tdap vaccine: decline- insurance/financial Shingles vaccine: due, check with insurance regarding coverage   Covid-19:Completed series   Advanced directives: Please bring a copy of your POA (Power of Attorney) and/or Living Will to your next appointment.   Conditions/risks identified: hyperlipidemia, hypertension  Next appointment: Follow up in one year for your annual wellness visit    Preventive Care 4 Years and Older, Female Preventive care refers to lifestyle choices and visits with your health care provider that can promote health and wellness. What does preventive care include?  A yearly physical exam. This is also called an annual well check.  Dental exams once or twice a year.  Routine eye exams. Ask your health care provider how often you should have your eyes checked.  Personal lifestyle choices, including:  Daily care of your teeth and gums.  Regular physical activity.  Eating a healthy diet.  Avoiding tobacco and drug use.  Limiting alcohol use.  Practicing safe sex.  Taking low-dose aspirin every day.  Taking vitamin and mineral supplements as recommended by your health care provider. What happens during an annual well check? The services and screenings done by  your health care provider during your annual well check will depend on your age, overall health, lifestyle risk factors, and family history of disease. Counseling  Your health care provider may ask you questions about your:  Alcohol use.  Tobacco use.  Drug use.  Emotional well-being.  Home and relationship well-being.  Sexual activity.  Eating habits.  History of falls.  Memory and ability to understand (cognition).  Work and work Statistician.  Reproductive health. Screening  You may have the following tests or measurements:  Height, weight, and BMI.  Blood pressure.  Lipid and cholesterol levels. These may be checked every 5 years, or more frequently if you are over 40 years old.  Skin check.  Lung cancer screening. You may have this screening every year starting at age 38 if you have a 30-pack-year history of smoking and currently smoke or have quit within the past 15 years.  Fecal occult blood test (FOBT) of the stool. You may have this test every year starting at age 84.  Flexible sigmoidoscopy or colonoscopy. You may have a sigmoidoscopy every 5 years or a colonoscopy every 10 years starting at age 73.  Hepatitis C blood test.  Hepatitis B blood test.  Sexually transmitted disease (STD) testing.  Diabetes screening. This is done by checking your blood sugar (glucose) after you have not eaten for a while (fasting). You may have this done every 1-3 years.  Bone density scan. This is done to screen for osteoporosis. You may have this done starting at age 61.  Mammogram. This may be done every 1-2 years. Talk to your health care provider about how often you should have regular mammograms. Talk with your health care provider about your test results, treatment options, and  if necessary, the need for more tests. Vaccines  Your health care provider may recommend certain vaccines, such as:  Influenza vaccine. This is recommended every year.  Tetanus,  diphtheria, and acellular pertussis (Tdap, Td) vaccine. You may need a Td booster every 10 years.  Zoster vaccine. You may need this after age 64.  Pneumococcal 13-valent conjugate (PCV13) vaccine. One dose is recommended after age 34.  Pneumococcal polysaccharide (PPSV23) vaccine. One dose is recommended after age 80. Talk to your health care provider about which screenings and vaccines you need and how often you need them. This information is not intended to replace advice given to you by your health care provider. Make sure you discuss any questions you have with your health care provider. Document Released: 12/14/2015 Document Revised: 08/06/2016 Document Reviewed: 09/18/2015 Elsevier Interactive Patient Education  2017 [place] Prevention in the Home Falls can cause injuries. They can happen to people of all ages. There are many things you can do to make your home safe and to help prevent falls. What can I do on the outside of my home?  Regularly fix the edges of walkways and driveways and fix any cracks.  Remove anything that might make you trip as you walk through a door, such as a raised step or threshold.  Trim any bushes or trees on the path to your home.  Use bright outdoor lighting.  Clear any walking paths of anything that might make someone trip, such as rocks or tools.  Regularly check to see if handrails are loose or broken. Make sure that both sides of any steps have handrails.  Any raised decks and porches should have guardrails on the edges.  Have any leaves, snow, or ice cleared regularly.  Use sand or salt on walking paths during winter.  Clean up any spills in your garage right away. This includes oil or grease spills. What can I do in the bathroom?  Use night lights.  Install grab bars by the toilet and in the tub and shower. Do not use towel bars as grab bars.  Use non-skid mats or decals in the tub or shower.  If you need to sit down in  the shower, use a plastic, non-slip stool.  Keep the floor dry. Clean up any water that spills on the floor as soon as it happens.  Remove soap buildup in the tub or shower regularly.  Attach bath mats securely with double-sided non-slip rug tape.  Do not have throw rugs and other things on the floor that can make you trip. What can I do in the bedroom?  Use night lights.  Make sure that you have a light by your bed that is easy to reach.  Do not use any sheets or blankets that are too big for your bed. They should not hang down onto the floor.  Have a firm chair that has side arms. You can use this for support while you get dressed.  Do not have throw rugs and other things on the floor that can make you trip. What can I do in the kitchen?  Clean up any spills right away.  Avoid walking on wet floors.  Keep items that you use a lot in easy-to-reach places.  If you need to reach something above you, use a strong step stool that has a grab bar.  Keep electrical cords out of the way.  Do not use floor polish or wax that makes floors slippery. If you  must use wax, use non-skid floor wax.  Do not have throw rugs and other things on the floor that can make you trip. What can I do with my stairs?  Do not leave any items on the stairs.  Make sure that there are handrails on both sides of the stairs and use them. Fix handrails that are broken or loose. Make sure that handrails are as long as the stairways.  Check any carpeting to make sure that it is firmly attached to the stairs. Fix any carpet that is loose or worn.  Avoid having throw rugs at the top or bottom of the stairs. If you do have throw rugs, attach them to the floor with carpet tape.  Make sure that you have a light switch at the top of the stairs and the bottom of the stairs. If you do not have them, ask someone to add them for you. What else can I do to help prevent falls?  Wear shoes that:  Do not have high  heels.  Have rubber bottoms.  Are comfortable and fit you well.  Are closed at the toe. Do not wear sandals.  If you use a stepladder:  Make sure that it is fully opened. Do not climb a closed stepladder.  Make sure that both sides of the stepladder are locked into place.  Ask someone to hold it for you, if possible.  Clearly mark and make sure that you can see:  Any grab bars or handrails.  First and last steps.  Where the edge of each step is.  Use tools that help you move around (mobility aids) if they are needed. These include:  Canes.  Walkers.  Scooters.  Crutches.  Turn on the lights when you go into a dark area. Replace any light bulbs as soon as they burn out.  Set up your furniture so you have a clear path. Avoid moving your furniture around.  If any of your floors are uneven, fix them.  If there are any pets around you, be aware of where they are.  Review your medicines with your doctor. Some medicines can make you feel dizzy. This can increase your chance of falling. Ask your doctor what other things that you can do to help prevent falls. This information is not intended to replace advice given to you by your health care provider. Make sure you discuss any questions you have with your health care provider. Document Released: 09/13/2009 Document Revised: 04/24/2016 Document Reviewed: 12/22/2014 Elsevier Interactive Patient Education  2017 [employer].

## 2020-06-05 ENCOUNTER — Telehealth: Payer: Self-pay | Admitting: Family Medicine

## 2020-06-05 DIAGNOSIS — E785 Hyperlipidemia, unspecified: Secondary | ICD-10-CM

## 2020-06-05 DIAGNOSIS — E559 Vitamin D deficiency, unspecified: Secondary | ICD-10-CM

## 2020-06-05 DIAGNOSIS — I10 Essential (primary) hypertension: Secondary | ICD-10-CM

## 2020-06-05 DIAGNOSIS — M810 Age-related osteoporosis without current pathological fracture: Secondary | ICD-10-CM

## 2020-06-05 NOTE — Telephone Encounter (Signed)
-----   Message from Ellamae Sia sent at 05/23/2020  2:20 PM EDT ----- Regarding: Lab orders for Wednesday, 7.7.21  AWV lab orders, please.

## 2020-06-06 ENCOUNTER — Other Ambulatory Visit: Payer: Self-pay

## 2020-06-06 ENCOUNTER — Other Ambulatory Visit (INDEPENDENT_AMBULATORY_CARE_PROVIDER_SITE_OTHER): Payer: Medicare Other

## 2020-06-06 DIAGNOSIS — E559 Vitamin D deficiency, unspecified: Secondary | ICD-10-CM

## 2020-06-06 DIAGNOSIS — E785 Hyperlipidemia, unspecified: Secondary | ICD-10-CM

## 2020-06-06 DIAGNOSIS — I10 Essential (primary) hypertension: Secondary | ICD-10-CM | POA: Diagnosis not present

## 2020-06-06 LAB — CBC WITH DIFFERENTIAL/PLATELET
Basophils Absolute: 0.1 10*3/uL (ref 0.0–0.1)
Basophils Relative: 1.2 % (ref 0.0–3.0)
Eosinophils Absolute: 0.1 10*3/uL (ref 0.0–0.7)
Eosinophils Relative: 2.2 % (ref 0.0–5.0)
HCT: 40.4 % (ref 36.0–46.0)
Hemoglobin: 14.2 g/dL (ref 12.0–15.0)
Lymphocytes Relative: 27.7 % (ref 12.0–46.0)
Lymphs Abs: 1.3 10*3/uL (ref 0.7–4.0)
MCHC: 35.2 g/dL (ref 30.0–36.0)
MCV: 90.5 fl (ref 78.0–100.0)
Monocytes Absolute: 0.3 10*3/uL (ref 0.1–1.0)
Monocytes Relative: 7.7 % (ref 3.0–12.0)
Neutro Abs: 2.8 10*3/uL (ref 1.4–7.7)
Neutrophils Relative %: 61.2 % (ref 43.0–77.0)
Platelets: 309 10*3/uL (ref 150.0–400.0)
RBC: 4.46 Mil/uL (ref 3.87–5.11)
RDW: 12.5 % (ref 11.5–15.5)
WBC: 4.5 10*3/uL (ref 4.0–10.5)

## 2020-06-06 LAB — COMPREHENSIVE METABOLIC PANEL
ALT: 21 U/L (ref 0–35)
AST: 19 U/L (ref 0–37)
Albumin: 4.3 g/dL (ref 3.5–5.2)
Alkaline Phosphatase: 59 U/L (ref 39–117)
BUN: 18 mg/dL (ref 6–23)
CO2: 31 mEq/L (ref 19–32)
Calcium: 9.6 mg/dL (ref 8.4–10.5)
Chloride: 101 mEq/L (ref 96–112)
Creatinine, Ser: 0.88 mg/dL (ref 0.40–1.20)
GFR: 64.02 mL/min (ref >60.00)
Glucose, Bld: 96 mg/dL (ref 70–99)
Potassium: 3.4 mEq/L — ABNORMAL LOW (ref 3.5–5.1)
Sodium: 140 mEq/L (ref 135–145)
Total Bilirubin: 1 mg/dL (ref 0.2–1.2)
Total Protein: 6.7 g/dL (ref 6.0–8.3)

## 2020-06-06 LAB — VITAMIN D 25 HYDROXY (VIT D DEFICIENCY, FRACTURES): VITD: 39.92 ng/mL (ref 30.00–100.00)

## 2020-06-06 LAB — LIPID PANEL
Cholesterol: 187 mg/dL (ref 0–200)
HDL: 59.9 mg/dL (ref >39.00)
LDL Cholesterol: 104 mg/dL — ABNORMAL HIGH (ref 0–99)
NonHDL: 127.51
Total CHOL/HDL Ratio: 3
Triglycerides: 120 mg/dL (ref 0.0–149.0)
VLDL: 24 mg/dL (ref 0.0–40.0)

## 2020-06-06 LAB — TSH: TSH: 1.83 u[IU]/mL (ref 0.35–4.50)

## 2020-06-08 ENCOUNTER — Encounter: Payer: PRIVATE HEALTH INSURANCE | Admitting: Family Medicine

## 2020-06-11 ENCOUNTER — Encounter: Payer: Self-pay | Admitting: Family Medicine

## 2020-06-11 ENCOUNTER — Ambulatory Visit (INDEPENDENT_AMBULATORY_CARE_PROVIDER_SITE_OTHER): Payer: Medicare Other | Admitting: Family Medicine

## 2020-06-11 ENCOUNTER — Other Ambulatory Visit: Payer: Self-pay

## 2020-06-11 VITALS — BP 136/70 | HR 96 | Temp 96.7°F | Ht <= 58 in | Wt 165.1 lb

## 2020-06-11 DIAGNOSIS — I10 Essential (primary) hypertension: Secondary | ICD-10-CM | POA: Diagnosis not present

## 2020-06-11 DIAGNOSIS — M810 Age-related osteoporosis without current pathological fracture: Secondary | ICD-10-CM

## 2020-06-11 DIAGNOSIS — E6609 Other obesity due to excess calories: Secondary | ICD-10-CM

## 2020-06-11 DIAGNOSIS — E559 Vitamin D deficiency, unspecified: Secondary | ICD-10-CM | POA: Diagnosis not present

## 2020-06-11 DIAGNOSIS — Z6834 Body mass index (BMI) 34.0-34.9, adult: Secondary | ICD-10-CM

## 2020-06-11 DIAGNOSIS — E785 Hyperlipidemia, unspecified: Secondary | ICD-10-CM

## 2020-06-11 MED ORDER — LOSARTAN POTASSIUM-HCTZ 100-25 MG PO TABS: 1.0000 | ORAL_TABLET | Freq: Every day | ORAL | 3 refills | Status: DC

## 2020-06-11 NOTE — Assessment & Plan Note (Signed)
bp in fair control at this time  BP Readings from Last 1 Encounters:  06/11/20 136/70   No changes needed Most recent labs reviewed  Disc lifstyle change with low sodium diet and exercise

## 2020-06-11 NOTE — Assessment & Plan Note (Signed)
Discussed how this problem influences overall health and the risks it imposes  Reviewed plan for weight loss with lower calorie diet (via better food choices and also portion control or program like weight watchers) and exercise building up to or more than 30 minutes 5 days per week including some aerobic activity    

## 2020-06-11 NOTE — Assessment & Plan Note (Signed)
Vitamin D level is therapeutic with current supplementation Disc importance of this to bone and overall health Level of 39.9

## 2020-06-11 NOTE — Assessment & Plan Note (Signed)
Good control with diet  Disc goals for lipids and reasons to control them Rev last labs with pt Rev low sat fat diet in detail Urged to continue eating salmon for HDL

## 2020-06-11 NOTE — Progress Notes (Signed)
Subjective:    Patient ID: Gabriella Santiago, female    DOB: 1951-07-24, 69 y.o.   MRN: [id number]  This visit occurred during the SARS-CoV-2 public health emergency.  Safety protocols were in place, including screening questions prior to the visit, additional usage of staff PPE, and extensive cleaning of exam room while observing appropriate contact time as indicated for disinfecting solutions.    HPI Pt presents for f/u of chronic health problems   Wt Readings from Last 3 Encounters:  06/11/20 165 lb 2 oz (74.9 kg)  06/01/20 164 lb (74.4 kg)  06/07/19 163 lb 6 oz (74.1 kg)   34.51 kg/m   Retired then took another job working 4 hours per day  Keeps her busy  Very happy with it  Taking care of herself    Had amw on 7/2  No concerns noted  Td postponed-financial   Mammogram 8/20- will get a reminder  Self breast exam - no lumps or changes   No gyn problems   Colonoscopy 6/18   dexa 7/20 - OP Has taken one course of fosamax in the past and declines another tx  Falls-none Fractures-none  Supplements- vit d  Vit D level is 39.9 Exercise - walking   Is covid immunized  Zoster status -has not had imm  Interested in it if paid for   HTN  bp is stable today  No cp or palpitations or headaches or edema  No side effects to medicines  BP Readings from Last 3 Encounters:  06/11/20 136/70  06/07/19 126/74  03/23/18 136/78     Pulse Readings from Last 3 Encounters:  06/11/20 96  06/07/19 82  03/23/18 86    Hyperlipidemia  Lab Results  Component Value Date   CHOL 187 06/06/2020   CHOL 180 05/31/2019   CHOL 179 03/18/2018   Lab Results  Component Value Date   HDL 59.90 06/06/2020   HDL 61.70 05/31/2019   HDL 60.90 03/18/2018   Lab Results  Component Value Date   LDLCALC 104 (H) 06/06/2020   LDLCALC 99 05/31/2019   LDLCALC 98 03/18/2018   Lab Results  Component Value Date   TRIG 120.0 06/06/2020   TRIG 98.0 05/31/2019   TRIG 99.0 03/18/2018   Lab  Results  Component Value Date   CHOLHDL 3 06/06/2020   CHOLHDL 3 05/31/2019   CHOLHDL 3 03/18/2018   Lab Results  Component Value Date   LDLDIRECT 116.8 01/24/2013  diet has been good  Eating a lot of fruits and veg Less meat  Rarely eats fried food  Eats grilled salmon  Otherwise does not eat much meat     Other labs: Lab Results  Component Value Date   CREATININE 0.88 06/06/2020   BUN 18 06/06/2020   NA 140 06/06/2020   K 3.4 (L) 06/06/2020   CL 101 06/06/2020   CO2 31 06/06/2020    Lab Results  Component Value Date   ALT 21 06/06/2020   AST 19 06/06/2020   ALKPHOS 59 06/06/2020   BILITOT 1.0 06/06/2020   Glucose 96 Lab Results  Component Value Date   TSH 1.83 06/06/2020    Lab Results  Component Value Date   WBC 4.5 06/06/2020   HGB 14.2 06/06/2020   HCT 40.4 06/06/2020   MCV 90.5 06/06/2020   PLT 309.0 06/06/2020    Patient Active Problem List   Diagnosis Date Noted  . Low back pain 06/07/2019  . Welcome to Medicare preventive visit  03/23/2018  . Vitamin D deficiency 03/23/2018  . Screening mammogram, encounter for 03/17/2017  . Estrogen deficiency 03/17/2017  . Obesity 05/11/2015  . Encounter for routine gynecological examination 01/31/2013  . Colon cancer screening 01/31/2013  . Routine general medical examination at a health care facility 01/23/2013  . Hyperlipidemia, mild 03/01/2008  . Essential hypertension 03/01/2008  . GERD 03/01/2008  . FIBROCYSTIC BREAST DISEASE 03/01/2008  . POSTMENOPAUSAL STATUS 03/01/2008  . Osteoporosis 03/01/2008   Past Medical History:  Diagnosis Date  . Allergy   . Anemia    years ago   . Hypertension    Past Surgical History:  Procedure Laterality Date  . TUBAL LIGATION     Social History   Tobacco Use  . Smoking status: Never Smoker  . Smokeless tobacco: Never Used  Vaping Use  . Vaping Use: Never used  Substance Use Topics  . Alcohol use: No    Alcohol/week: 0.0 standard drinks  . Drug use:  No   Family History  Problem Relation Age of Onset  . Breast cancer Cousin   . Colon cancer Neg Hx   . Colon polyps Neg Hx   . Esophageal cancer Neg Hx   . Rectal cancer Neg Hx   . Stomach cancer Neg Hx    Allergies  Allergen Reactions  . Shellfish Allergy Rash   Current Outpatient Medications on File Prior to Visit  Medication Sig Dispense Refill  . Cholecalciferol (VITAMIN D3) 125 MCG (5000 UT) CAPS Take 1 capsule by mouth daily.      No current facility-administered medications on file prior to visit.    Review of Systems  Constitutional: Negative for activity change, appetite change, fatigue, fever and unexpected weight change.  HENT: Negative for congestion, ear pain, rhinorrhea, sinus pressure and sore throat.   Eyes: Negative for pain, redness and visual disturbance.  Respiratory: Negative for cough, shortness of breath and wheezing.   Cardiovascular: Negative for chest pain and palpitations.  Gastrointestinal: Negative for abdominal pain, blood in stool, constipation and diarrhea.  Endocrine: Negative for polydipsia and polyuria.  Genitourinary: Negative for dysuria, frequency and urgency.  Musculoskeletal: Negative for arthralgias, back pain and myalgias.  Skin: Negative for pallor and rash.  Allergic/Immunologic: Negative for environmental allergies.  Neurological: Negative for dizziness, syncope and headaches.  Hematological: Negative for adenopathy. Does not bruise/bleed easily.  Psychiatric/Behavioral: Negative for decreased concentration and dysphoric mood. The patient is not nervous/anxious.        Objective:   Physical Exam Constitutional:      General: She is not in acute distress.    Appearance: Normal appearance. She is well-developed. She is obese. She is not ill-appearing or diaphoretic.  HENT:     Head: Normocephalic and atraumatic.     Right Ear: Tympanic membrane, ear canal and external ear normal.     Left Ear: Tympanic membrane, ear canal and  external ear normal.     Nose: Nose normal. No congestion.     Mouth/Throat:     Mouth: Mucous membranes are moist.     Pharynx: Oropharynx is clear. No posterior oropharyngeal erythema.  Eyes:     General: No scleral icterus.    Extraocular Movements: Extraocular movements intact.     Conjunctiva/sclera: Conjunctivae normal.     Pupils: Pupils are equal, round, and reactive to light.  Neck:     Thyroid: No thyromegaly.     Vascular: No carotid bruit or JVD.  Cardiovascular:     Rate and  Rhythm: Normal rate and regular rhythm.     Pulses: Normal pulses.     Heart sounds: Normal heart sounds. No gallop.   Pulmonary:     Effort: Pulmonary effort is normal. No respiratory distress.     Breath sounds: Normal breath sounds. No wheezing.     Comments: Good air exch Chest:     Chest wall: No tenderness.  Abdominal:     General: Bowel sounds are normal. There is no distension or abdominal bruit.     Palpations: Abdomen is soft. There is no mass.     Tenderness: There is no abdominal tenderness.     Hernia: No hernia is present.  Genitourinary:    Comments: Breast exam: No mass, nodules, thickening, tenderness, bulging, retraction, inflamation, nipple discharge or skin changes noted.  No axillary or clavicular LA.     Musculoskeletal:        General: No tenderness. Normal range of motion.     Cervical back: Normal range of motion and neck supple. No rigidity. No muscular tenderness.     Right lower leg: No edema.     Left lower leg: No edema.     Comments: No kyphosis   Lymphadenopathy:     Cervical: No cervical adenopathy.  Skin:    General: Skin is warm and dry.     Coloration: Skin is not pale.     Findings: No erythema or rash.     Comments: Fair complexion Light SKs scattered on trunk  Neurological:     Mental Status: She is alert. Mental status is at baseline.     Cranial Nerves: No cranial nerve deficit.     Motor: No abnormal muscle tone.     Coordination: Coordination  normal.     Gait: Gait normal.     Deep Tendon Reflexes: Reflexes are normal and symmetric. Reflexes normal.  Psychiatric:        Mood and Affect: Mood normal.        Cognition and Memory: Cognition normal.     Comments: Cheerful             Assessment & Plan:   Problem List Items Addressed This Visit      Cardiovascular and Mediastinum   Essential hypertension - Primary    bp in fair control at this time  BP Readings from Last 1 Encounters:  06/11/20 136/70   No changes needed Most recent labs reviewed  Disc lifstyle change with low sodium diet and exercise        Relevant Medications   losartan-hydrochlorothiazide (HYZAAR) 100-25 MG tablet     Musculoskeletal and Integument   Osteoporosis    dexa rev from 7/20 - OP slt worse  No falls/fx Pt declines another bisphosphonate course at this time  Taking D/eating ca rich foods  Enc her to keep walking  Will re check in a year        Other   Hyperlipidemia, mild    Good control with diet  Disc goals for lipids and reasons to control them Rev last labs with pt Rev low sat fat diet in detail Urged to continue eating salmon for HDL      Relevant Medications   losartan-hydrochlorothiazide (HYZAAR) 100-25 MG tablet   Obesity    Discussed how this problem influences overall health and the risks it imposes  Reviewed plan for weight loss with lower calorie diet (via better food choices and also portion control or program like weight watchers)  and exercise building up to or more than 30 minutes 5 days per week including some aerobic activity         Vitamin D deficiency    Vitamin D level is therapeutic with current supplementation Disc importance of this to bone and overall health Level of 39.9

## 2020-06-11 NOTE — Assessment & Plan Note (Signed)
dexa rev from 7/20 - OP slt worse  No falls/fx Pt declines another bisphosphonate course at this time  Taking D/eating ca rich foods  Enc her to keep walking  Will re check in a year

## 2020-06-11 NOTE — Patient Instructions (Addendum)
If you are interested in the new shingles vaccine (Shingrix) - call your local pharmacy to check on coverage and availability  If affordable, get on a wait list at your pharmacy to get the vaccine.  For cholesterol Avoid red meat/ fried foods/ egg yolks/ fatty breakfast meats/ butter, cheese and high fat dairy/ and shellfish    Make sure you are getting protein  Nuts, dried beans, nut butters, dairy, oat milk , eggs  Protein powder in smoothie is ok also   Let us know if you want to take another course of fosamax   Take care of yourself

## 2020-07-05 DIAGNOSIS — H02831 Dermatochalasis of right upper eyelid: Secondary | ICD-10-CM | POA: Diagnosis not present

## 2020-07-05 DIAGNOSIS — H40052 Ocular hypertension, left eye: Secondary | ICD-10-CM | POA: Diagnosis not present

## 2020-07-05 DIAGNOSIS — H02834 Dermatochalasis of left upper eyelid: Secondary | ICD-10-CM | POA: Diagnosis not present

## 2020-07-16 DIAGNOSIS — H52223 Regular astigmatism, bilateral: Secondary | ICD-10-CM | POA: Diagnosis not present

## 2020-07-16 DIAGNOSIS — H02834 Dermatochalasis of left upper eyelid: Secondary | ICD-10-CM | POA: Diagnosis not present

## 2020-07-16 DIAGNOSIS — H524 Presbyopia: Secondary | ICD-10-CM | POA: Diagnosis not present

## 2020-07-16 DIAGNOSIS — H5213 Myopia, bilateral: Secondary | ICD-10-CM | POA: Diagnosis not present

## 2020-07-16 DIAGNOSIS — H02831 Dermatochalasis of right upper eyelid: Secondary | ICD-10-CM | POA: Diagnosis not present

## 2020-07-16 DIAGNOSIS — H2513 Age-related nuclear cataract, bilateral: Secondary | ICD-10-CM | POA: Diagnosis not present

## 2020-07-16 DIAGNOSIS — H40023 Open angle with borderline findings, high risk, bilateral: Secondary | ICD-10-CM | POA: Diagnosis not present

## 2020-08-02 ENCOUNTER — Other Ambulatory Visit: Payer: Self-pay | Admitting: Family Medicine

## 2020-08-18 ENCOUNTER — Other Ambulatory Visit: Payer: Self-pay | Admitting: Family Medicine

## 2020-08-18 DIAGNOSIS — Z23 Encounter for immunization: Secondary | ICD-10-CM | POA: Diagnosis not present

## 2020-08-28 DIAGNOSIS — H02831 Dermatochalasis of right upper eyelid: Secondary | ICD-10-CM | POA: Diagnosis not present

## 2020-08-28 DIAGNOSIS — H53483 Generalized contraction of visual field, bilateral: Secondary | ICD-10-CM | POA: Diagnosis not present

## 2020-08-28 DIAGNOSIS — I10 Essential (primary) hypertension: Secondary | ICD-10-CM | POA: Insufficient documentation

## 2020-08-28 DIAGNOSIS — H57813 Brow ptosis, bilateral: Secondary | ICD-10-CM | POA: Diagnosis not present

## 2020-08-28 DIAGNOSIS — H02834 Dermatochalasis of left upper eyelid: Secondary | ICD-10-CM | POA: Diagnosis not present

## 2020-08-28 DIAGNOSIS — H02423 Myogenic ptosis of bilateral eyelids: Secondary | ICD-10-CM | POA: Diagnosis not present

## 2020-08-28 DIAGNOSIS — H0279 Other degenerative disorders of eyelid and periocular area: Secondary | ICD-10-CM | POA: Diagnosis not present

## 2020-08-28 DIAGNOSIS — H02413 Mechanical ptosis of bilateral eyelids: Secondary | ICD-10-CM | POA: Diagnosis not present

## 2020-12-27 DIAGNOSIS — H2513 Age-related nuclear cataract, bilateral: Secondary | ICD-10-CM | POA: Diagnosis not present

## 2020-12-27 DIAGNOSIS — H43811 Vitreous degeneration, right eye: Secondary | ICD-10-CM | POA: Diagnosis not present

## 2020-12-27 DIAGNOSIS — H524 Presbyopia: Secondary | ICD-10-CM | POA: Diagnosis not present

## 2020-12-27 DIAGNOSIS — H40023 Open angle with borderline findings, high risk, bilateral: Secondary | ICD-10-CM | POA: Diagnosis not present

## 2020-12-27 DIAGNOSIS — H02834 Dermatochalasis of left upper eyelid: Secondary | ICD-10-CM | POA: Diagnosis not present

## 2020-12-27 DIAGNOSIS — H52223 Regular astigmatism, bilateral: Secondary | ICD-10-CM | POA: Diagnosis not present

## 2020-12-27 DIAGNOSIS — H5213 Myopia, bilateral: Secondary | ICD-10-CM | POA: Diagnosis not present

## 2020-12-27 DIAGNOSIS — H02831 Dermatochalasis of right upper eyelid: Secondary | ICD-10-CM | POA: Diagnosis not present

## 2021-01-28 DIAGNOSIS — H5213 Myopia, bilateral: Secondary | ICD-10-CM | POA: Diagnosis not present

## 2021-01-28 DIAGNOSIS — H02834 Dermatochalasis of left upper eyelid: Secondary | ICD-10-CM | POA: Diagnosis not present

## 2021-01-28 DIAGNOSIS — H02831 Dermatochalasis of right upper eyelid: Secondary | ICD-10-CM | POA: Diagnosis not present

## 2021-01-28 DIAGNOSIS — H52223 Regular astigmatism, bilateral: Secondary | ICD-10-CM | POA: Diagnosis not present

## 2021-01-28 DIAGNOSIS — H524 Presbyopia: Secondary | ICD-10-CM | POA: Diagnosis not present

## 2021-01-28 DIAGNOSIS — H40023 Open angle with borderline findings, high risk, bilateral: Secondary | ICD-10-CM | POA: Diagnosis not present

## 2021-01-28 DIAGNOSIS — H43811 Vitreous degeneration, right eye: Secondary | ICD-10-CM | POA: Diagnosis not present

## 2021-01-28 DIAGNOSIS — H2513 Age-related nuclear cataract, bilateral: Secondary | ICD-10-CM | POA: Diagnosis not present

## 2021-02-08 DIAGNOSIS — H02839 Dermatochalasis of unspecified eye, unspecified eyelid: Secondary | ICD-10-CM | POA: Diagnosis not present

## 2021-02-20 ENCOUNTER — Other Ambulatory Visit: Payer: Self-pay | Admitting: Family Medicine

## 2021-02-20 DIAGNOSIS — Z1231 Encounter for screening mammogram for malignant neoplasm of breast: Secondary | ICD-10-CM

## 2021-02-21 ENCOUNTER — Ambulatory Visit
Admission: RE | Admit: 2021-02-21 | Discharge: 2021-02-21 | Disposition: A | Discharge status: Home / Self Care | Payer: Medicare Other | Source: Ambulatory Visit | Attending: Family Medicine | Admitting: Family Medicine

## 2021-02-21 ENCOUNTER — Other Ambulatory Visit: Payer: Self-pay

## 2021-02-21 DIAGNOSIS — Z1231 Encounter for screening mammogram for malignant neoplasm of breast: Secondary | ICD-10-CM | POA: Diagnosis not present

## 2021-04-09 DIAGNOSIS — H5340 Unspecified visual field defects: Secondary | ICD-10-CM | POA: Insufficient documentation

## 2021-04-09 DIAGNOSIS — H02831 Dermatochalasis of right upper eyelid: Secondary | ICD-10-CM | POA: Diagnosis not present

## 2021-04-09 DIAGNOSIS — H02403 Unspecified ptosis of bilateral eyelids: Secondary | ICD-10-CM | POA: Diagnosis not present

## 2021-04-09 DIAGNOSIS — H02834 Dermatochalasis of left upper eyelid: Secondary | ICD-10-CM | POA: Diagnosis not present

## 2021-06-03 ENCOUNTER — Telehealth: Payer: Self-pay | Admitting: Family Medicine

## 2021-06-03 DIAGNOSIS — E559 Vitamin D deficiency, unspecified: Secondary | ICD-10-CM

## 2021-06-03 DIAGNOSIS — M810 Age-related osteoporosis without current pathological fracture: Secondary | ICD-10-CM

## 2021-06-03 DIAGNOSIS — I10 Essential (primary) hypertension: Secondary | ICD-10-CM

## 2021-06-03 DIAGNOSIS — E785 Hyperlipidemia, unspecified: Secondary | ICD-10-CM

## 2021-06-03 NOTE — Telephone Encounter (Signed)
-----   Message from Cloyd Stagers, RT sent at 05/20/2021 12:48 PM EDT ----- Regarding: Lab Orders for Tuesday 7.5.2022 Please place lab orders for Tuesday 7.5.2022, office visit for physical on Thursday 7.21.2022 Thank you, Dyke Maes RT(R)

## 2021-06-04 ENCOUNTER — Ambulatory Visit: Payer: PRIVATE HEALTH INSURANCE

## 2021-06-04 ENCOUNTER — Other Ambulatory Visit (INDEPENDENT_AMBULATORY_CARE_PROVIDER_SITE_OTHER): Payer: Medicare Other

## 2021-06-04 ENCOUNTER — Other Ambulatory Visit: Payer: Self-pay

## 2021-06-04 DIAGNOSIS — I10 Essential (primary) hypertension: Secondary | ICD-10-CM | POA: Diagnosis not present

## 2021-06-04 DIAGNOSIS — E785 Hyperlipidemia, unspecified: Secondary | ICD-10-CM

## 2021-06-04 DIAGNOSIS — E559 Vitamin D deficiency, unspecified: Secondary | ICD-10-CM | POA: Diagnosis not present

## 2021-06-04 LAB — COMPREHENSIVE METABOLIC PANEL
ALT: 19 U/L (ref 0–35)
AST: 18 U/L (ref 0–37)
Albumin: 4.2 g/dL (ref 3.5–5.2)
Alkaline Phosphatase: 66 U/L (ref 39–117)
BUN: 15 mg/dL (ref 6–23)
CO2: 31 mEq/L (ref 19–32)
Calcium: 9.3 mg/dL (ref 8.4–10.5)
Chloride: 105 mEq/L (ref 96–112)
Creatinine, Ser: 0.95 mg/dL (ref 0.40–1.20)
GFR: 61.62 mL/min (ref >60.00)
Glucose, Bld: 89 mg/dL (ref 70–99)
Potassium: 4.2 mEq/L (ref 3.5–5.1)
Sodium: 142 mEq/L (ref 135–145)
Total Bilirubin: 0.6 mg/dL (ref 0.2–1.2)
Total Protein: 6.2 g/dL (ref 6.0–8.3)

## 2021-06-04 LAB — CBC WITH DIFFERENTIAL/PLATELET
Basophils Absolute: 0 10*3/uL (ref 0.0–0.1)
Basophils Relative: 0.8 % (ref 0.0–3.0)
Eosinophils Absolute: 0.2 10*3/uL (ref 0.0–0.7)
Eosinophils Relative: 3.7 % (ref 0.0–5.0)
HCT: 40.8 % (ref 36.0–46.0)
Hemoglobin: 14.1 g/dL (ref 12.0–15.0)
Lymphocytes Relative: 26.4 % (ref 12.0–46.0)
Lymphs Abs: 1.2 10*3/uL (ref 0.7–4.0)
MCHC: 34.5 g/dL (ref 30.0–36.0)
MCV: 91.1 fl (ref 78.0–100.0)
Monocytes Absolute: 0.3 10*3/uL (ref 0.1–1.0)
Monocytes Relative: 6.9 % (ref 3.0–12.0)
Neutro Abs: 2.7 10*3/uL (ref 1.4–7.7)
Neutrophils Relative %: 62.2 % (ref 43.0–77.0)
Platelets: 251 10*3/uL (ref 150.0–400.0)
RBC: 4.48 Mil/uL (ref 3.87–5.11)
RDW: 13.1 % (ref 11.5–15.5)
WBC: 4.4 10*3/uL (ref 4.0–10.5)

## 2021-06-04 LAB — LIPID PANEL
Cholesterol: 187 mg/dL (ref 0–200)
HDL: 54.3 mg/dL (ref >39.00)
LDL Cholesterol: 106 mg/dL — ABNORMAL HIGH (ref 0–99)
NonHDL: 133.14
Total CHOL/HDL Ratio: 3
Triglycerides: 138 mg/dL (ref 0.0–149.0)
VLDL: 27.6 mg/dL (ref 0.0–40.0)

## 2021-06-04 LAB — TSH: TSH: 1.89 u[IU]/mL (ref 0.35–5.50)

## 2021-06-04 LAB — VITAMIN D 25 HYDROXY (VIT D DEFICIENCY, FRACTURES): VITD: 66.36 ng/mL (ref 30.00–100.00)

## 2021-06-07 ENCOUNTER — Ambulatory Visit (INDEPENDENT_AMBULATORY_CARE_PROVIDER_SITE_OTHER): Payer: Medicare Other

## 2021-06-07 DIAGNOSIS — Z0000 Encounter for general adult medical examination without abnormal findings: Secondary | ICD-10-CM

## 2021-06-07 NOTE — Patient Instructions (Signed)
Gabriella Santiago , Thank you for taking time to come for your Medicare Wellness Visit. I appreciate your ongoing commitment to your health goals. Please review the following plan we discussed and let me know if I can assist you in the future.   Screening recommendations/referrals: Colonoscopy: Up to date, completed 05/18/2017, due 05/2027 Mammogram: Up to date, completed 02/21/2021, due 01/2022 Bone Density: Up to date, completed 07/01/2019, due 06/30/2021 Recommended yearly ophthalmology/optometry visit for glaucoma screening and checkup Recommended yearly dental visit for hygiene and checkup  Vaccinations: Influenza vaccine: Up to date, completed 08/08/2020, due 07/2021 Pneumococcal vaccine: Completed series Tdap vaccine: decline-insurance  Shingles vaccine: due, check with your insurance regarding coverage if interested    Covid-19:completed 3 vaccines  Advanced directives: Please bring a copy of your POA (Power of Attorney) and/or Living Will to your next appointment.    Conditions/risks identified: hypertension, hyperlipidemia   Next appointment: Follow up in one year for your annual wellness visit    Preventive Care 68 Years and Older, Female Preventive care refers to lifestyle choices and visits with your health care provider that can promote health and wellness. What does preventive care include? A yearly physical exam. This is also called an annual well check. Dental exams once or twice a year. Routine eye exams. Ask your health care provider how often you should have your eyes checked. Personal lifestyle choices, including: Daily care of your teeth and gums. Regular physical activity. Eating a healthy diet. Avoiding tobacco and drug use. Limiting alcohol use. Practicing safe sex. Taking low-dose aspirin every day. Taking vitamin and mineral supplements as recommended by your health care provider. What happens during an annual well check? The services and screenings done by your  health care provider during your annual well check will depend on your age, overall health, lifestyle risk factors, and family history of disease. Counseling  Your health care provider may ask you questions about your: Alcohol use. Tobacco use. Drug use. Emotional well-being. Home and relationship well-being. Sexual activity. Eating habits. History of falls. Memory and ability to understand (cognition). Work and work Statistician. Reproductive health. Screening  You may have the following tests or measurements: Height, weight, and BMI. Blood pressure. Lipid and cholesterol levels. These may be checked every 5 years, or more frequently if you are over 35 years old. Skin check. Lung cancer screening. You may have this screening every year starting at age 65 if you have a 30-pack-year history of smoking and currently smoke or have quit within the past 15 years. Fecal occult blood test (FOBT) of the stool. You may have this test every year starting at age 1. Flexible sigmoidoscopy or colonoscopy. You may have a sigmoidoscopy every 5 years or a colonoscopy every 10 years starting at age 26. Hepatitis C blood test. Hepatitis B blood test. Sexually transmitted disease (STD) testing. Diabetes screening. This is done by checking your blood sugar (glucose) after you have not eaten for a while (fasting). You may have this done every 1-3 years. Bone density scan. This is done to screen for osteoporosis. You may have this done starting at age 40. Mammogram. This may be done every 1-2 years. Talk to your health care provider about how often you should have regular mammograms. Talk with your health care provider about your test results, treatment options, and if necessary, the need for more tests. Vaccines  Your health care provider may recommend certain vaccines, such as: Influenza vaccine. This is recommended every year. Tetanus, diphtheria, and acellular  pertussis (Tdap, Td) vaccine. You may  need a Td booster every 10 years. Zoster vaccine. You may need this after age 55. Pneumococcal 13-valent conjugate (PCV13) vaccine. One dose is recommended after age 75. Pneumococcal polysaccharide (PPSV23) vaccine. One dose is recommended after age 17. Talk to your health care provider about which screenings and vaccines you need and how often you need them. This information is not intended to replace advice given to you by your health care provider. Make sure you discuss any questions you have with your health care provider. Document Released: 12/14/2015 Document Revised: 08/06/2016 Document Reviewed: 09/18/2015 Elsevier Interactive Patient Education  2017 [place] Prevention in the Home Falls can cause injuries. They can happen to people of all ages. There are many things you can do to make your home safe and to help prevent falls. What can I do on the outside of my home? Regularly fix the edges of walkways and driveways and fix any cracks. Remove anything that might make you trip as you walk through a door, such as a raised step or threshold. Trim any bushes or trees on the path to your home. Use bright outdoor lighting. Clear any walking paths of anything that might make someone trip, such as rocks or tools. Regularly check to see if handrails are loose or broken. Make sure that both sides of any steps have handrails. Any raised decks and porches should have guardrails on the edges. Have any leaves, snow, or ice cleared regularly. Use sand or salt on walking paths during winter. Clean up any spills in your garage right away. This includes oil or grease spills. What can I do in the bathroom? Use night lights. Install grab bars by the toilet and in the tub and shower. Do not use towel bars as grab bars. Use non-skid mats or decals in the tub or shower. If you need to sit down in the shower, use a plastic, non-slip stool. Keep the floor dry. Clean up any water that spills on  the floor as soon as it happens. Remove soap buildup in the tub or shower regularly. Attach bath mats securely with double-sided non-slip rug tape. Do not have throw rugs and other things on the floor that can make you trip. What can I do in the bedroom? Use night lights. Make sure that you have a light by your bed that is easy to reach. Do not use any sheets or blankets that are too big for your bed. They should not hang down onto the floor. Have a firm chair that has side arms. You can use this for support while you get dressed. Do not have throw rugs and other things on the floor that can make you trip. What can I do in the kitchen? Clean up any spills right away. Avoid walking on wet floors. Keep items that you use a lot in easy-to-reach places. If you need to reach something above you, use a strong step stool that has a grab bar. Keep electrical cords out of the way. Do not use floor polish or wax that makes floors slippery. If you must use wax, use non-skid floor wax. Do not have throw rugs and other things on the floor that can make you trip. What can I do with my stairs? Do not leave any items on the stairs. Make sure that there are handrails on both sides of the stairs and use them. Fix handrails that are broken or loose. Make sure that handrails  are as long as the stairways. Check any carpeting to make sure that it is firmly attached to the stairs. Fix any carpet that is loose or worn. Avoid having throw rugs at the top or bottom of the stairs. If you do have throw rugs, attach them to the floor with carpet tape. Make sure that you have a light switch at the top of the stairs and the bottom of the stairs. If you do not have them, ask someone to add them for you. What else can I do to help prevent falls? Wear shoes that: Do not have high heels. Have rubber bottoms. Are comfortable and fit you well. Are closed at the toe. Do not wear sandals. If you use a stepladder: Make sure  that it is fully opened. Do not climb a closed stepladder. Make sure that both sides of the stepladder are locked into place. Ask someone to hold it for you, if possible. Clearly mark and make sure that you can see: Any grab bars or handrails. First and last steps. Where the edge of each step is. Use tools that help you move around (mobility aids) if they are needed. These include: Canes. Walkers. Scooters. Crutches. Turn on the lights when you go into a dark area. Replace any light bulbs as soon as they burn out. Set up your furniture so you have a clear path. Avoid moving your furniture around. If any of your floors are uneven, fix them. If there are any pets around you, be aware of where they are. Review your medicines with your doctor. Some medicines can make you feel dizzy. This can increase your chance of falling. Ask your doctor what other things that you can do to help prevent falls. This information is not intended to replace advice given to you by your health care provider. Make sure you discuss any questions you have with your health care provider. Document Released: 09/13/2009 Document Revised: 04/24/2016 Document Reviewed: 12/22/2014 Elsevier Interactive Patient Education  2017 [employer].

## 2021-06-07 NOTE — Progress Notes (Signed)
PCP notes:  Health Maintenance: Shingrix- due   Abnormal Screenings: none   Patient concerns: none   Nurse concerns: none   Next PCP appt.: 06/20/2021 @ 8 am

## 2021-06-07 NOTE — Progress Notes (Signed)
Subjective:   Gabriella Santiago is a 70 y.o. female who presents for Medicare Annual (Subsequent) preventive examination.  Review of Systems: N/A      I connected with the patient today by telephone and verified that I am speaking with the correct person using two identifiers. Location patient: home Location nurse: work Persons participating in the telephone visit: patient, nurse.   I discussed the limitations, risks, security and privacy concerns of performing an evaluation and management service by telephone and the availability of in person appointments. I also discussed with the patient that there may be a patient responsible charge related to this service. The patient expressed understanding and verbally consented to this telephonic visit.        Cardiac Risk Factors include: advanced age (>43men, >29 women);hypertension;Other (see comment), Risk factor comments: hyperlipidemia     Objective:    Today's Vitals   There is no height or weight on file to calculate BMI.  Advanced Directives 06/07/2021 06/01/2020 04/01/2019 05/04/2017  Does Patient Have a Medical Advance Directive? Yes Yes Yes Yes  Type of Paramedic of [place];Living will [place];Living will [place];Living will [place];Living will  Copy of [place] in Chart? No - copy requested No - copy requested No - copy requested -    Current Medications (verified) Outpatient Encounter Medications as of 06/07/2021  Medication Sig   Cholecalciferol (VITAMIN D3) 125 MCG (5000 UT) CAPS Take 1 capsule by mouth daily.    losartan-hydrochlorothiazide (HYZAAR) 100-25 MG tablet TAKE 1 TABLET BY MOUTH EVERY DAY   No facility-administered encounter medications on file as of 06/07/2021.    Allergies (verified) Shellfish allergy   History: Past Medical History:  Diagnosis Date   Allergy    Anemia    years ago    Hypertension     Past Surgical History:  Procedure Laterality Date   TUBAL LIGATION     Family History  Problem Relation Age of Onset   Breast cancer Cousin    Colon cancer Neg Hx    Colon polyps Neg Hx    Esophageal cancer Neg Hx    Rectal cancer Neg Hx    Stomach cancer Neg Hx    Social History   Socioeconomic History   Marital status: Single    Spouse name: Not on file   Number of children: Not on file   Years of education: Not on file   Highest education level: Not on file  Occupational History   Not on file  Tobacco Use   Smoking status: Never   Smokeless tobacco: Never  Vaping Use   Vaping Use: Never used  Substance and Sexual Activity   Alcohol use: No    Alcohol/week: 0.0 standard drinks   Drug use: No   Sexual activity: Not on file  Other Topics Concern   Not on file  Social History Narrative   Not on file   Social Determinants of Health   Financial Resource Strain: Low Risk    Difficulty of Paying Living Expenses: Not hard at all  Food Insecurity: No Food Insecurity   Worried About Charity fundraiser in the Last Year: Never true   Ran Out of Food in the Last Year: Never true  Transportation Needs: No Transportation Needs   Lack of Transportation (Medical): No   Lack of Transportation (Non-Medical): No  Physical Activity: Sufficiently Active   Days of Exercise per Week:  3 days   Minutes of Exercise per Session: 50 min  Stress: No Stress Concern Present   Feeling of Stress : Not at all  Social Connections: Not on file    Tobacco Counseling Counseling given: Not Answered   Clinical Intake:  Pre-visit preparation completed: Yes  Pain : No/denies pain     Nutritional Risks: None Diabetes: No  How often do you need to have someone help you when you read instructions, pamphlets, or other written materials from your doctor or pharmacy?: 1 - Never  Diabetic: No Nutrition Risk Assessment:  Has the patient had any N/V/D within the last 2 months?  No   Does the patient have any non-healing wounds?  No  Has the patient had any unintentional weight loss or weight gain?  No   Diabetes:  Is the patient diabetic?  No  If diabetic, was a CBG obtained today?   N/A Did the patient bring in their glucometer from home?   N/A How often do you monitor your CBG's? N/A.   Financial Strains and Diabetes Management:  Are you having any financial strains with the device, your supplies or your medication?  N/A .  Does the patient want to be seen by Chronic Care Management for management of their diabetes?   N/A Would the patient like to be referred to a Nutritionist or for Diabetic Management?   N/A   Interpreter Needed?: No  Information entered by :: CJohnson, LPN   Activities of Daily Living In your present state of health, do you have any difficulty performing the following activities: 06/07/2021  Hearing? N  Vision? N  Difficulty concentrating or making decisions? N  Walking or climbing stairs? N  Dressing or bathing? N  Doing errands, shopping? N  Preparing Food and eating ? N  Using the Toilet? N  In the past six months, have you accidently leaked urine? N  Do you have problems with loss of bowel control? N  Managing your Medications? N  Managing your Finances? N  Housekeeping or managing your Housekeeping? N  Some recent data might be hidden    Patient Care Team: Tower, Wynelle Fanny, MD as PCP - General  Indicate any recent Medical Services you may have received from other than [facility] providers in the past year (date may be approximate).     Assessment:   This is a routine wellness examination for Gabriella Santiago.  Hearing/Vision screen Vision Screening - Comments:: Patient gets annual eye exams   Dietary issues and exercise activities discussed: Current Exercise Habits: Structured exercise class, Type of exercise: treadmill, Time (Minutes): 35, Frequency (Times/Week): 3, Weekly Exercise (Minutes/Week): 105, Intensity: Moderate, Exercise  limited by: None identified   Goals Addressed             This Visit's Progress    Patient Stated       06/07/2021, I will continue to walk on the treadmill and do leg exercises for 35 minutes 3 days a week.        Depression Screen PHQ 2/9 Scores 06/07/2021 06/01/2020 04/01/2019 03/23/2018 01/31/2013  PHQ - 2 Score 0 0 0 0 0  PHQ- 9 Score 0 0 0 - -    Fall Risk Fall Risk  06/07/2021 06/01/2020 04/01/2019 03/23/2018  Falls in the past year? 0 0 0 No  Number falls in past yr: 0 0 - -  Injury with Fall? 0 0 - -  Risk for fall due to : Medication side effect No Fall  Risks - -  Follow up Falls evaluation completed;Falls prevention discussed Falls evaluation completed;Falls prevention discussed - -    FALL RISK PREVENTION PERTAINING TO THE HOME:  Any stairs in or around the home? Yes  If so, are there any without handrails? No  Home free of loose throw rugs in walkways, pet beds, electrical cords, etc? Yes  Adequate lighting in your home to reduce risk of falls? Yes   ASSISTIVE DEVICES UTILIZED TO PREVENT FALLS:  Life alert? No  Use of a cane, walker or w/c? No  Grab bars in the bathroom? No  Shower chair or bench in shower? No  Elevated toilet seat or a handicapped toilet? No   TIMED UP AND GO:  Was the test performed?  N/A telephone visit .    Cognitive Function: MMSE - Mini Mental State Exam 06/07/2021 06/01/2020 04/01/2019  Orientation to time 5 5 5   Orientation to Place 5 5 5   Registration 3 3 3   Attention/ Calculation 5 5 0  Recall 3 3 3   Language- name 2 objects - - 0  Language- repeat 1 1 1   Language- follow 3 step command - - 0  Language- read & follow direction - - 0  Write a sentence - - 0  Copy design - - 0  Total score - - 17  Mini Cog  Mini-Cog screen was completed. Maximum score is 22. A value of 0 denotes this part of the MMSE was not completed or the patient failed this part of the Mini-Cog screening.       Immunizations Immunization History  Administered  Date(s) Administered   Influenza Inj Mdck Quad Pf 01/16/2018   Influenza Whole 12/01/1998   Influenza, High Dose Seasonal PF 08/26/2019   Influenza,inj,Quad PF,6+ Mos 10/01/2018   Influenza-Unspecified 10/16/2015   PFIZER(Purple Top)SARS-COV-2 Vaccination 01/11/2020, 02/01/2020, 12/06/2020   Pneumococcal Conjugate-13 03/23/2018   Pneumococcal Polysaccharide-23 06/07/2019   Td 07/25/1998, 03/01/2008   Unspecified SARS-COV-2 Vaccination 01/11/2020, 02/01/2020    TDAP status: Due, Education has been provided regarding the importance of this vaccine. Advised may receive this vaccine at local pharmacy or Health Dept. Aware to provide a copy of the vaccination record if obtained from local pharmacy or Health Dept. Verbalized acceptance and understanding.  Flu Vaccine status: Up to date  Pneumococcal vaccine status: Up to date  Covid-19 vaccine status: Completed 3 vaccines  Qualifies for Shingles Vaccine? Yes   Zostavax completed No   Shingrix Completed?: No.    Education has been provided regarding the importance of this vaccine. Patient has been advised to call insurance company to determine out of pocket expense if they have not yet received this vaccine. Advised may also receive vaccine at local pharmacy or Health Dept. Verbalized acceptance and understanding.  Screening Tests Health Maintenance  Topic Date Due   Hepatitis C Screening  Never done   Zoster Vaccines- Shingrix (1 of 2) Never done   TETANUS/TDAP  03/01/2018   COVID-19 Vaccine (4 - Booster for Pfizer series) 04/05/2021   INFLUENZA VACCINE  07/01/2021   MAMMOGRAM  02/22/2023   COLONOSCOPY (Pts 45-17yrs Insurance coverage will need to be confirmed)  05/19/2027   DEXA SCAN  Completed   PNA vac Low Risk Adult  Completed   HPV VACCINES  Aged Out    Health Maintenance  Health Maintenance Due  Topic Date Due   Hepatitis C Screening  Never done   Zoster Vaccines- Shingrix (1 of 2) Never done   TETANUS/TDAP  03/01/2018  COVID-19 Vaccine (4 - Booster for Pfizer series) 04/05/2021    Colorectal cancer screening: Type of screening: Colonoscopy. Completed 05/18/2017. Repeat every 10 years  Mammogram status: Completed 02/21/2021. Repeat every year  Bone Density status: Completed 07/01/2019. Results reflect: Bone density results: OSTEOPOROSIS. Repeat every 2 years.  Lung Cancer Screening: (Low Dose CT Chest recommended if Age 20-80 years, 30 pack-year currently smoking OR have quit w/in 15years.) does not qualify.    Additional Screening:  Hepatitis C Screening: does qualify; Completed due  Vision Screening: Recommended annual ophthalmology exams for early detection of glaucoma and other disorders of the eye. Is the patient up to date with their annual eye exam?  Yes  Who is the provider or what is the name of the office in which the patient attends annual eye exams? Eyelid surgery scheduled 08/08/2021 with Dr. Carlis Abbott at [facility] If pt is not established with a provider, would they like to be referred to a provider to establish care? No .   Dental Screening: Recommended annual dental exams for proper oral hygiene  Community Resource Referral / Chronic Care Management: CRR required this visit?  No   CCM required this visit?  No      Plan:     I have personally reviewed and noted the following in the patient's chart:   Medical and social history Use of alcohol, tobacco or illicit drugs  Current medications and supplements including opioid prescriptions.  Functional ability and status Nutritional status Physical activity Advanced directives List of other physicians Hospitalizations, surgeries, and ER visits in previous 12 months Vitals Screenings to include cognitive, depression, and falls Referrals and appointments  In addition, I have reviewed and discussed with patient certain preventive protocols, quality metrics, and best practice recommendations. A written personalized care plan for preventive  services as well as general preventive health recommendations were provided to patient.   Due to this being a telephonic visit, the after visit summary with patients personalized plan was offered to patient via office or my-chart. Patient preferred to pick up at office at next visit or via mychart.   Andrez Grime, LPN   12/05/5206

## 2021-06-20 ENCOUNTER — Encounter: Payer: Self-pay | Admitting: Family Medicine

## 2021-06-20 ENCOUNTER — Ambulatory Visit (INDEPENDENT_AMBULATORY_CARE_PROVIDER_SITE_OTHER): Payer: Medicare Other | Admitting: Family Medicine

## 2021-06-20 ENCOUNTER — Other Ambulatory Visit: Payer: Self-pay

## 2021-06-20 VITALS — BP 128/68 | HR 76 | Temp 98.3°F | Ht <= 58 in | Wt 164.4 lb

## 2021-06-20 DIAGNOSIS — E6609 Other obesity due to excess calories: Secondary | ICD-10-CM | POA: Diagnosis not present

## 2021-06-20 DIAGNOSIS — I10 Essential (primary) hypertension: Secondary | ICD-10-CM

## 2021-06-20 DIAGNOSIS — M810 Age-related osteoporosis without current pathological fracture: Secondary | ICD-10-CM

## 2021-06-20 DIAGNOSIS — E2839 Other primary ovarian failure: Secondary | ICD-10-CM

## 2021-06-20 DIAGNOSIS — Z6834 Body mass index (BMI) 34.0-34.9, adult: Secondary | ICD-10-CM | POA: Diagnosis not present

## 2021-06-20 DIAGNOSIS — E559 Vitamin D deficiency, unspecified: Secondary | ICD-10-CM

## 2021-06-20 DIAGNOSIS — E785 Hyperlipidemia, unspecified: Secondary | ICD-10-CM

## 2021-06-20 MED ORDER — LOSARTAN POTASSIUM-HCTZ 100-25 MG PO TABS: 1.0000 | ORAL_TABLET | Freq: Every day | ORAL | 3 refills | Status: DC

## 2021-06-20 NOTE — Patient Instructions (Addendum)
If you are interested in the new shingles vaccine (Shingrix) - call your local pharmacy to check on coverage and availability  If affordable, get on a wait list at your pharmacy to get the vaccine.  For cholesterol  Avoid red meat/ fried foods/ egg yolks/ fatty breakfast meats/ butter, cheese and high fat dairy/ and shellfish    For weight  Try to get most of your carbohydrates from produce (with the exception of white potatoes)  Eat less bread/pasta/rice/snack foods/cereals/sweets and other items from the middle of the grocery store (processed carbs)   Keep taking care of yourself  Keep exercising

## 2021-06-20 NOTE — Assessment & Plan Note (Signed)
Discussed how this problem influences overall health and the risks it imposes  Reviewed plan for weight loss with lower calorie diet (via better food choices and also portion control or program like weight watchers) and exercise building up to or more than 30 minutes 5 days per week including some aerobic activity   Enc to keep exercising and watch carb intake

## 2021-06-20 NOTE — Assessment & Plan Note (Signed)
bp in fair control at this time  BP Readings from Last 1 Encounters:  06/20/21 128/68   No changes needed Most recent labs reviewed  Disc lifstyle change with low sodium diet and exercise  Plan to continue losartan hct 100-25 mg daily

## 2021-06-20 NOTE — Progress Notes (Signed)
Subjective:    Patient ID: Gabriella Santiago, female    DOB: 06/12/1951, 70 y.o.   MRN: [id number]  This visit occurred during the SARS-CoV-2 public health emergency.  Safety protocols were in place, including screening questions prior to the visit, additional usage of staff PPE, and extensive cleaning of exam room while observing appropriate contact time as indicated for disinfecting solutions.   HPI Pt presents for annual f/u of chronic medical problems   Wt Readings from Last 3 Encounters:  06/20/21 164 lb 6 oz (74.6 kg)  06/11/20 165 lb 2 oz (74.9 kg)  06/01/20 164 lb (74.4 kg)   34.35 kg/m  Taking care of her parents  A lot of effort  Fair self care  Takes her vitamins   Had amw on 7/8  Zoster status - unsure if shingrix is covered   Covid immunized  Mammogram 3/22 Self breast exam-no lumps  Has had soreness in the past - goes away   Colonoscopy 6/18   HTN bp is stable today  No cp or palpitations or headaches or edema  No side effects to medicines  BP Readings from Last 3 Encounters:  06/20/21 128/68  06/11/20 136/70  06/07/19 126/74     Taking losartan hct 100-25 mg daily  Pulse Readings from Last 3 Encounters:  06/20/21 76  06/11/20 96  06/07/19 82    OP Dexa 7/20  Took a 5 y course of alendronate in the past  Takes vit D Vit D level 66.3 Falls- none Fractures -none Supplements vit D Exercise - going to the gym and walking on the treadmill (makes the effort)   Hyperlipidemia Lab Results  Component Value Date   CHOL 187 06/04/2021   CHOL 187 06/06/2020   CHOL 180 05/31/2019   Lab Results  Component Value Date   HDL 54.30 06/04/2021   HDL 59.90 06/06/2020   HDL 61.70 05/31/2019   Lab Results  Component Value Date   LDLCALC 106 (H) 06/04/2021   LDLCALC 104 (H) 06/06/2020   LDLCALC 99 05/31/2019   Lab Results  Component Value Date   TRIG 138.0 06/04/2021   TRIG 120.0 06/06/2020   TRIG 98.0 05/31/2019   Lab Results  Component  Value Date   CHOLHDL 3 06/04/2021   CHOLHDL 3 06/06/2020   CHOLHDL 3 05/31/2019   Lab Results  Component Value Date   LDLDIRECT 116.8 01/24/2013    Diet controlled She does eat eggs - 1 a day  Does not eat red meat or fried food   Other labs Lab Results  Component Value Date   WBC 4.4 06/04/2021   HGB 14.1 06/04/2021   HCT 40.8 06/04/2021   MCV 91.1 06/04/2021   PLT 251.0 06/04/2021   Lab Results  Component Value Date   TSH 1.89 06/04/2021    Lab Results  Component Value Date   CREATININE 0.95 06/04/2021   BUN 15 06/04/2021   NA 142 06/04/2021   K 4.2 06/04/2021   CL 105 06/04/2021   CO2 31 06/04/2021   Lab Results  Component Value Date   ALT 19 06/04/2021   AST 18 06/04/2021   ALKPHOS 66 06/04/2021   BILITOT 0.6 06/04/2021    Patient Active Problem List   Diagnosis Date Noted   Welcome to Medicare preventive visit 03/23/2018   Vitamin D deficiency 03/23/2018   Screening mammogram, encounter for 03/17/2017   Estrogen deficiency 03/17/2017   Obesity 05/11/2015   Encounter for routine gynecological examination 01/31/2013  Colon cancer screening 01/31/2013   Routine general medical examination at a health care facility 01/23/2013   Hyperlipidemia, mild 03/01/2008   Essential hypertension 03/01/2008   GERD 03/01/2008   FIBROCYSTIC BREAST DISEASE 03/01/2008   POSTMENOPAUSAL STATUS 03/01/2008   Osteoporosis 03/01/2008   Past Medical History:  Diagnosis Date   Allergy    Anemia    years ago    Hypertension    Past Surgical History:  Procedure Laterality Date   TUBAL LIGATION     Social History   Tobacco Use   Smoking status: Never   Smokeless tobacco: Never  Vaping Use   Vaping Use: Never used  Substance Use Topics   Alcohol use: No    Alcohol/week: 0.0 standard drinks   Drug use: No   Family History  Problem Relation Age of Onset   Breast cancer Cousin    Colon cancer Neg Hx    Colon polyps Neg Hx    Esophageal cancer Neg Hx     Rectal cancer Neg Hx    Stomach cancer Neg Hx    Allergies  Allergen Reactions   Shellfish Allergy Rash   Current Outpatient Medications on File Prior to Visit  Medication Sig Dispense Refill   Cholecalciferol (VITAMIN D3) 125 MCG (5000 UT) CAPS Take 1 capsule by mouth daily.      No current facility-administered medications on file prior to visit.    Review of Systems  Constitutional:  Negative for activity change, appetite change, fatigue, fever and unexpected weight change.  HENT:  Negative for congestion, ear pain, rhinorrhea, sinus pressure and sore throat.   Eyes:  Negative for pain, redness and visual disturbance.  Respiratory:  Negative for cough, shortness of breath and wheezing.   Cardiovascular:  Negative for chest pain and palpitations.  Gastrointestinal:  Negative for abdominal pain, blood in stool, constipation and diarrhea.  Endocrine: Negative for polydipsia and polyuria.  Genitourinary:  Negative for dysuria, frequency and urgency.  Musculoskeletal:  Negative for arthralgias, back pain and myalgias.  Skin:  Negative for pallor and rash.  Allergic/Immunologic: Negative for environmental allergies.  Neurological:  Negative for dizziness, syncope and headaches.  Hematological:  Negative for adenopathy. Does not bruise/bleed easily.  Psychiatric/Behavioral:  Negative for decreased concentration and dysphoric mood. The patient is not nervous/anxious.       Objective:   Physical Exam Constitutional:      General: She is not in acute distress.    Appearance: Normal appearance. She is well-developed. She is obese. She is not ill-appearing or diaphoretic.  HENT:     Head: Normocephalic and atraumatic.     Right Ear: Tympanic membrane, ear canal and external ear normal.     Left Ear: Tympanic membrane, ear canal and external ear normal.     Nose: Nose normal. No congestion.     Mouth/Throat:     Mouth: Mucous membranes are moist.     Pharynx: Oropharynx is clear. No  posterior oropharyngeal erythema.  Eyes:     General: No scleral icterus.    Extraocular Movements: Extraocular movements intact.     Conjunctiva/sclera: Conjunctivae normal.     Pupils: Pupils are equal, round, and reactive to light.  Neck:     Thyroid: No thyromegaly.     Vascular: No carotid bruit or JVD.  Cardiovascular:     Rate and Rhythm: Normal rate and regular rhythm.     Pulses: Normal pulses.     Heart sounds: Normal heart sounds.  No gallop.  Pulmonary:     Effort: Pulmonary effort is normal. No respiratory distress.     Breath sounds: Normal breath sounds. No wheezing.     Comments: Good air exch Chest:     Chest wall: No tenderness.  Abdominal:     General: Bowel sounds are normal. There is no distension or abdominal bruit.     Palpations: Abdomen is soft. There is no mass.     Tenderness: There is no abdominal tenderness.     Hernia: No hernia is present.  Genitourinary:    Comments: Breast exam: No mass, nodules, thickening, tenderness, bulging, retraction, inflamation, nipple discharge or skin changes noted.  No axillary or clavicular LA.     Musculoskeletal:        General: No tenderness. Normal range of motion.     Cervical back: Normal range of motion and neck supple. No rigidity. No muscular tenderness.     Right lower leg: No edema.     Left lower leg: No edema.     Comments: Mild kyphosis   Lymphadenopathy:     Cervical: No cervical adenopathy.  Skin:    General: Skin is warm and dry.     Coloration: Skin is not pale.     Findings: No erythema or rash.     Comments: Solar lentigines diffusely Fair complexion  Neurological:     Mental Status: She is alert. Mental status is at baseline.     Cranial Nerves: No cranial nerve deficit.     Motor: No abnormal muscle tone.     Coordination: Coordination normal.     Gait: Gait normal.     Deep Tendon Reflexes: Reflexes are normal and symmetric. Reflexes normal.  Psychiatric:        Mood and Affect:  Mood normal.        Cognition and Memory: Cognition and memory normal.          Assessment & Plan:   Problem List Items Addressed This Visit       Cardiovascular and Mediastinum   Essential hypertension - Primary    bp in fair control at this time  BP Readings from Last 1 Encounters:  06/20/21 128/68  No changes needed Most recent labs reviewed  Disc lifstyle change with low sodium diet and exercise  Plan to continue losartan hct 100-25 mg daily       Relevant Medications   losartan-hydrochlorothiazide (HYZAAR) 100-25 MG tablet     Musculoskeletal and Integument   Osteoporosis    dexa ordered  Past h/o alendronate  Taking vit D with therapeutic level  Good exercise  No falls or fx  Disc need for calcium/ vitamin D/ wt bearing exercise and bone density test every 2 y to monitor Disc safety/ fracture risk in detail           Other   Hyperlipidemia, mild    Disc goals for lipids and reasons to control them Rev last labs with pt Rev low sat fat diet in detail Diet controlled Disc goal of LDL below 100 if possible  Will cut back on red meat       Relevant Medications   losartan-hydrochlorothiazide (HYZAAR) 100-25 MG tablet   Obesity    Discussed how this problem influences overall health and the risks it imposes  Reviewed plan for weight loss with lower calorie diet (via better food choices and also portion control or program like weight watchers) and exercise building up to or more  than 30 minutes 5 days per week including some aerobic activity   Enc to keep exercising and watch carb intake       Estrogen deficiency   Relevant Orders   DG Bone Density   Vitamin D deficiency    Vitamin D level is therapeutic with current supplementation Disc importance of this to bone and overall health Continues 5000 iu daily  Level of 66.3

## 2021-06-20 NOTE — Assessment & Plan Note (Signed)
Disc goals for lipids and reasons to control them Rev last labs with pt Rev low sat fat diet in detail Diet controlled Disc goal of LDL below 100 if possible  Will cut back on red meat

## 2021-06-20 NOTE — Assessment & Plan Note (Signed)
dexa ordered  Past h/o alendronate  Taking vit D with therapeutic level  Good exercise  No falls or fx  Disc need for calcium/ vitamin D/ wt bearing exercise and bone density test every 2 y to monitor Disc safety/ fracture risk in detail

## 2021-06-20 NOTE — Assessment & Plan Note (Signed)
Vitamin D level is therapeutic with current supplementation Disc importance of this to bone and overall health Continues 5000 iu daily  Level of 66.3

## 2021-08-08 DIAGNOSIS — E669 Obesity, unspecified: Secondary | ICD-10-CM | POA: Diagnosis not present

## 2021-08-08 DIAGNOSIS — Z6834 Body mass index (BMI) 34.0-34.9, adult: Secondary | ICD-10-CM | POA: Diagnosis not present

## 2021-08-08 DIAGNOSIS — H02831 Dermatochalasis of right upper eyelid: Secondary | ICD-10-CM | POA: Diagnosis not present

## 2021-08-08 DIAGNOSIS — Z79899 Other long term (current) drug therapy: Secondary | ICD-10-CM | POA: Diagnosis not present

## 2021-08-08 DIAGNOSIS — I10 Essential (primary) hypertension: Secondary | ICD-10-CM | POA: Diagnosis not present

## 2021-08-08 DIAGNOSIS — H02403 Unspecified ptosis of bilateral eyelids: Secondary | ICD-10-CM | POA: Diagnosis not present

## 2021-08-08 DIAGNOSIS — H02834 Dermatochalasis of left upper eyelid: Secondary | ICD-10-CM | POA: Diagnosis not present

## 2021-09-19 ENCOUNTER — Ambulatory Visit (INDEPENDENT_AMBULATORY_CARE_PROVIDER_SITE_OTHER): Payer: Medicare Other | Admitting: Dermatology

## 2021-09-19 ENCOUNTER — Other Ambulatory Visit: Payer: Self-pay

## 2021-09-19 ENCOUNTER — Encounter: Payer: Self-pay | Admitting: Dermatology

## 2021-09-19 DIAGNOSIS — Z85828 Personal history of other malignant neoplasm of skin: Secondary | ICD-10-CM | POA: Diagnosis not present

## 2021-09-19 DIAGNOSIS — L578 Other skin changes due to chronic exposure to nonionizing radiation: Secondary | ICD-10-CM | POA: Diagnosis not present

## 2021-09-19 DIAGNOSIS — L821 Other seborrheic keratosis: Secondary | ICD-10-CM

## 2021-09-19 DIAGNOSIS — Z1283 Encounter for screening for malignant neoplasm of skin: Secondary | ICD-10-CM

## 2021-09-19 DIAGNOSIS — D1800 Hemangioma unspecified site: Secondary | ICD-10-CM | POA: Diagnosis not present

## 2021-09-19 DIAGNOSIS — L814 Other melanin hyperpigmentation: Secondary | ICD-10-CM

## 2021-09-19 DIAGNOSIS — C44612 Basal cell carcinoma of skin of right upper limb, including shoulder: Secondary | ICD-10-CM | POA: Diagnosis not present

## 2021-09-19 DIAGNOSIS — D485 Neoplasm of uncertain behavior of skin: Secondary | ICD-10-CM

## 2021-09-19 DIAGNOSIS — D229 Melanocytic nevi, unspecified: Secondary | ICD-10-CM

## 2021-09-19 DIAGNOSIS — C4491 Basal cell carcinoma of skin, unspecified: Secondary | ICD-10-CM

## 2021-09-19 HISTORY — DX: Basal cell carcinoma of skin, unspecified: C44.91

## 2021-09-19 NOTE — Progress Notes (Signed)
Follow-Up Visit   Subjective  Gabriella Santiago is a 70 y.o. female who presents for the following: Annual Exam (Hx of skin cancer pt unsure which type - she has noticed a lesion in her L scalp that she would like checked today.). The patient presents for Total-Body Skin Exam (TBSE) for skin cancer screening and mole check.  The following portions of the chart were reviewed this encounter and updated as appropriate:   Tobacco  Allergies  Meds  Problems  Med Hx  Surg Hx  Fam Hx      Review of Systems:  No other skin or systemic complaints except as noted in HPI or Assessment and Plan.  Objective  Well appearing patient in no apparent distress; mood and affect are within normal limits.  A full examination was performed including scalp, head, eyes, ears, nose, lips, neck, chest, axillae, abdomen, back, buttocks, bilateral upper extremities, bilateral lower extremities, hands, feet, fingers, toes, fingernails, and toenails. All findings within normal limits unless otherwise noted below.  abdomen Clear.   R forearm 0.8 cm thin pink papule.      Assessment & Plan  History of skin cancer abdomen  Patient unsure which type and no pathology available (20 years ago)  Clear no evidence for recurrence. Observe for recurrence. Call clinic for new or changing lesions.  Recommend regular skin exams, daily broad-spectrum spf 30+ sunscreen use, and photoprotection.     Neoplasm of uncertain behavior of skin R forearm  Skin / nail biopsy Type of biopsy: tangential   Informed consent: discussed and consent obtained   Timeout: patient name, date of birth, surgical site, and procedure verified   Procedure prep:  Patient was prepped and draped in usual sterile fashion Prep type:  Isopropyl alcohol Anesthesia: the lesion was anesthetized in a standard fashion   Anesthetic:  1% lidocaine w/ epinephrine 1-100,000 buffered w/ 8.4% NaHCO3 Instrument used: flexible razor blade    Hemostasis achieved with: pressure, aluminum chloride and electrodesiccation   Outcome: patient tolerated procedure well   Post-procedure details: sterile dressing applied and wound care instructions given   Dressing type: bandage and petrolatum    Specimen 1 - Surgical pathology Differential Diagnosis: D48.5 r/o BCC  Check Margins: No  Lentigines - Scattered tan macules - Due to sun exposure - Benign-appearing, observe - Recommend daily broad spectrum sunscreen SPF 30+ to sun-exposed areas, reapply every 2 hours as needed. - Call for any changes  Seborrheic Keratoses - L scalp - Stuck-on, waxy, tan-brown papules and/or plaques  - Benign-appearing - Discussed benign etiology and prognosis. - Observe - Call for any changes  Melanocytic Nevi - Tan-brown and/or pink-flesh-colored symmetric macules and papules - Benign appearing on exam today - Observation - Call clinic for new or changing moles - Recommend daily use of broad spectrum spf 30+ sunscreen to sun-exposed areas.   Hemangiomas - Red papules - Discussed benign nature - Observe - Call for any changes  Actinic Damage - Chronic condition, secondary to cumulative UV/sun exposure - diffuse scaly erythematous macules with underlying dyspigmentation - Recommend daily broad spectrum sunscreen SPF 30+ to sun-exposed areas, reapply every 2 hours as needed.  - Staying in the shade or wearing long sleeves, sun glasses (UVA+UVB protection) and wide brim hats (4-inch brim around the entire circumference of the hat) are also recommended for sun protection.  - Call for new or changing lesions.  Skin cancer screening performed today.  Return in about 1 year (around 09/19/2022) for TBSE.  Luther Redo, CMA, am acting as scribe for Forest Gleason, MD .  Documentation: I have reviewed the above documentation for accuracy and completeness, and I agree with the above.  Forest Gleason, MD

## 2021-09-19 NOTE — Patient Instructions (Addendum)
Recommend taking Heliocare sun protection supplement daily in sunny weather for additional sun protection. For maximum protection on the sunniest days, you can take up to 2 capsules of regular Heliocare OR take 1 capsule of Heliocare Ultra. For prolonged exposure (such as a full day in the sun), you can repeat your dose of the supplement 4 hours after your first dose. Heliocare can be purchased at [facility] or at [email or web address].    If you have any questions or concerns for your doctor, please call our main line at [phone] and press option 4 to reach your doctor's medical assistant. If no one answers, please leave a voicemail as directed and we will return your call as soon as possible. Messages left after 4 pm will be answered the following business day.   You may also send us a message via MyChart. We typically respond to MyChart messages within 1-2 business days.  For prescription refills, please ask your pharmacy to contact our office. Our fax number is [phone].  If you have an urgent issue when the clinic is closed that cannot wait until the next business day, you can page your doctor at the number below.    Please note that while we do our best to be available for urgent issues outside of office hours, we are not available 24/7.   If you have an urgent issue and are unable to reach us, you may choose to seek medical care at your doctor's office, retail clinic, urgent care center, or emergency room.  If you have a medical emergency, please immediately call 911 or go to the emergency department.  Pager Numbers  - Dr. Kowalski: [phone]  - Dr. Moye: [phone]  - Dr. Stewart: [phone]  In the event of inclement weather, please call our main line at [phone] for an update on the status of any delays or closures.  Dermatology Medication Tips: Please keep the boxes that topical medications come in in order to help keep track of the instructions about  where and how to use these. Pharmacies typically print the medication instructions only on the boxes and not directly on the medication tubes.   If your medication is too expensive, please contact our office at [phone] option 4 or send us a message through MyChart.   We are unable to tell what your co-pay for medications will be in advance as this is different depending on your insurance coverage. However, we may be able to find a substitute medication at lower cost or fill out paperwork to get insurance to cover a needed medication.   If a prior authorization is required to get your medication covered by your insurance company, please allow us 1-2 business days to complete this process.  Drug prices often vary depending on where the prescription is filled and some pharmacies may offer cheaper prices.  The website www.goodrx.com contains coupons for medications through different pharmacies. The prices here do not account for what the cost may be with help from insurance (it may be cheaper with your insurance), but the website can give you the price if you did not use any insurance.  - You can print the associated coupon and take it with your prescription to the pharmacy.  - You may also stop by our office during regular business hours and pick up a GoodRx coupon card.  - If you need your prescription sent electronically to a different pharmacy, notify our office through [place] MyChart or by phone at [phone] option   4.  Wound Care Instructions  Cleanse wound gently with soap and water once a day then pat dry with clean gauze. Apply a thing coat of Petrolatum (petroleum jelly, "Vaseline") over the wound (unless you have an allergy to this). We recommend that you use a new, sterile tube of Vaseline. Do not pick or remove scabs. Do not remove the yellow or white "healing tissue" from the base of the wound.  Cover the wound with fresh, clean, nonstick gauze and secure with paper tape. You  may use Band-Aids in place of gauze and tape if the would is small enough, but would recommend trimming much of the tape off as there is often too much. Sometimes Band-Aids can irritate the skin.  You should call the office for your biopsy report after 1 week if you have not already been contacted.  If you experience any problems, such as abnormal amounts of bleeding, swelling, significant bruising, significant pain, or evidence of infection, please call the office immediately.  FOR ADULT SURGERY PATIENTS: If you need something for pain relief you may take 1 extra strength Tylenol (acetaminophen) AND 2 Ibuprofen (200mg  each) together every 4 hours as needed for pain. (do not take these if you are allergic to them or if you have a reason you should not take them.) Typically, you may only need pain medication for 1 to 3 days.

## 2021-09-24 ENCOUNTER — Telehealth: Payer: Self-pay

## 2021-09-24 NOTE — Telephone Encounter (Signed)
Called pt discussed biopsy results, pt scheduled for [facility] Nov 15

## 2021-10-15 ENCOUNTER — Ambulatory Visit: Payer: Medicare Other | Admitting: Dermatology

## 2021-11-12 ENCOUNTER — Encounter: Payer: Self-pay | Admitting: Dermatology

## 2021-11-12 ENCOUNTER — Ambulatory Visit (INDEPENDENT_AMBULATORY_CARE_PROVIDER_SITE_OTHER): Payer: Medicare Other | Admitting: Dermatology

## 2021-11-12 ENCOUNTER — Other Ambulatory Visit: Payer: Self-pay

## 2021-11-12 DIAGNOSIS — C44612 Basal cell carcinoma of skin of right upper limb, including shoulder: Secondary | ICD-10-CM

## 2021-11-12 DIAGNOSIS — C4491 Basal cell carcinoma of skin, unspecified: Secondary | ICD-10-CM

## 2021-11-12 MED ORDER — MUPIROCIN 2 % EX OINT: TOPICAL_OINTMENT | CUTANEOUS | 0 refills | Status: DC

## 2021-11-12 NOTE — Patient Instructions (Addendum)
Start over the counter blister band aid brand   Wound Care Instructions  Cleanse wound gently with soap and water once a day then pat dry with clean gauze. Apply a thing coat of Mupirocin ointment) over the wound (unless you have an allergy to this). Do not pick or remove scabs. Do not remove the yellow or white "healing tissue" from the base of the wound.  Cover the wound with fresh, clean, nonstick gauze and secure with paper tape. You may use Band-Aids in place of gauze and tape if the would is small enough, but would recommend trimming much of the tape off as there is often too much. Sometimes Band-Aids can irritate the skin.  You should call the office for your biopsy report after 1 week if you have not already been contacted.  If you experience any problems, such as abnormal amounts of bleeding, swelling, significant bruising, significant pain, or evidence of infection, please call the office immediately.  FOR ADULT SURGERY PATIENTS: If you need something for pain relief you may take 1 extra strength Tylenol (acetaminophen) AND 2 Ibuprofen (200mg  each) together every 4 hours as needed for pain. (do not take these if you are allergic to them or if you have a reason you should not take them.) Typically, you may only need pain medication for 1 to 3 days.       Gentle Skin Care Guide  1. Bathe no more than once a day.  2. Avoid bathing in hot water  3. Use a mild soap like Dove, Vanicream, Cetaphil, CeraVe. Can use Lever 2000 or Cetaphil antibacterial soap  4. Use soap only where you need it. On most days, use it under your arms, between your legs, and on your feet. Let the water rinse other areas unless visibly dirty.  5. When you get out of the bath/shower, use a towel to gently blot your skin dry, don't rub it.  6. While your skin is still a little damp, apply a moisturizing cream such as Vanicream, CeraVe, Cetaphil, Eucerin, Sarna lotion or plain Vaseline Jelly. For hands apply  Neutrogena [place] Hand Cream or Excipial Hand Cream.  7. Reapply moisturizer any time you start to itch or feel dry.  8. Sometimes using free and clear laundry detergents can be helpful. Fabric softener sheets should be avoided. Downy Free & Gentle liquid, or any liquid fabric softener that is free of dyes and perfumes, it acceptable to use  9. If your doctor has given you prescription creams you may apply moisturizers over them        If You Need Anything After Your Visit  If you have any questions or concerns for your doctor, please call our main line at [phone] and press option 4 to reach your doctor's medical assistant. If no one answers, please leave a voicemail as directed and we will return your call as soon as possible. Messages left after 4 pm will be answered the following business day.   You may also send [place] a message via [place]. We typically respond to MyChart messages within 1-2 business days.  For prescription refills, please ask your pharmacy to contact our office. Our fax number is [phone].  If you have an urgent issue when the clinic is closed that cannot wait until the next business day, you can page your doctor at the number below.    Please note that while we do our best to be available for urgent issues outside of office hours, we are  not available 24/7.   If you have an urgent issue and are unable to reach [place], you may choose to seek medical care at your doctor's office, retail clinic, urgent care center, or emergency room.  If you have a medical emergency, please immediately call 911 or go to the emergency department.  Pager Numbers  - Dr. Nehemiah Massed: [phone]  - Dr. Laurence Ferrari: [phone]  - Dr. Nicole Kindred: [phone]  In the event of inclement weather, please call our main line at [phone] for an update on the status of any delays or closures.  Dermatology Medication Tips: Please keep the boxes that topical medications come in in order to  help keep track of the instructions about where and how to use these. Pharmacies typically print the medication instructions only on the boxes and not directly on the medication tubes.   If your medication is too expensive, please contact our office at [phone] option 4 or send [place] a message through [place].   We are unable to tell what your co-pay for medications will be in advance as this is different depending on your insurance coverage. However, we may be able to find a substitute medication at lower cost or fill out paperwork to get insurance to cover a needed medication.   If a prior authorization is required to get your medication covered by your insurance company, please allow [place] 1-2 business days to complete this process.  Drug prices often vary depending on where the prescription is filled and some pharmacies may offer cheaper prices.  The website www.goodrx.com contains coupons for medications through different pharmacies. The prices here do not account for what the cost may be with help from insurance (it may be cheaper with your insurance), but the website can give you the price if you did not use any insurance.  - You can print the associated coupon and take it with your prescription to the pharmacy.  - You may also stop by our office during regular business hours and pick up a GoodRx coupon card.  - If you need your prescription sent electronically to a different pharmacy, notify our office through [facility] or by phone at [phone] option 4.     Si Usted Necesita Algo Despus de Su Visita  Tambin puede enviarnos un mensaje a travs de Pharmacist, community. Por lo general respondemos a los mensajes de MyChart en el transcurso de 1 a 2 das hbiles.  Para renovar recetas, por favor pida a su farmacia que se ponga en contacto con nuestra oficina. Harland Dingwall de fax es [place] [phone].  Si tiene un asunto urgente cuando la clnica est cerrada y que no puede esperar hasta  el siguiente da hbil, puede llamar/localizar a su doctor(a) al nmero que aparece a continuacin.   Por favor, tenga en cuenta que aunque hacemos todo lo posible para estar disponibles para asuntos urgentes fuera del horario de [place], no estamos disponibles las 24 horas del da, los 7 das de la [place].   Si tiene un problema urgente y no puede comunicarse con nosotros, puede optar por buscar atencin mdica  en el consultorio de su doctor(a), en una clnica privada, en un centro de atencin urgente o en una sala de emergencias.  Si tiene Engineering geologist, por favor llame inmediatamente al 911 o vaya a la sala de emergencias.  Nmeros de bper  - Dr. Nehemiah Massed: [phone]  - Dra. Moye: [phone]  - Dra. Nicole Kindred: [phone]  En caso de inclemencias del tiempo, por favor  llame a Cleotis Nipper lnea principal al [phone] para una actualizacin sobre el [place] de cualquier retraso o cierre.  Consejos para la medicacin en dermatologa: Por favor, guarde las cajas en las que vienen los medicamentos de uso tpico para ayudarle a seguir las instrucciones sobre dnde y cmo usarlos. Las farmacias generalmente imprimen las instrucciones del medicamento slo en las cajas y no directamente en los tubos del [place].   Si su medicamento es muy caro, por favor, pngase en contacto con Zigmund Daniel llamando al [phone] y presione la opcin 4 o envenos un mensaje a travs de Pharmacist, community.   No podemos decirle cul ser su copago por los medicamentos por adelantado ya que esto es diferente dependiendo de la cobertura de su seguro. Sin embargo, es posible que podamos encontrar un medicamento sustituto a Electrical engineer un formulario para que el seguro cubra el medicamento que se considera necesario.   Si se requiere una autorizacin previa para que su compaa de seguros [place] su medicamento, por favor permtanos de 1 a 2 das hbiles para completar este proceso.  Los precios de los  medicamentos varan con frecuencia dependiendo del Environmental consultant de dnde se surte la receta y alguna farmacias pueden ofrecer precios ms baratos.  El sitio web www.goodrx.com tiene cupones para medicamentos de Airline pilot. Los precios aqu no tienen en cuenta lo que podra costar con la ayuda del seguro (puede ser ms barato con su seguro), pero el sitio web puede darle el precio si no utiliz Research scientist (physical sciences).  - Puede imprimir el cupn correspondiente y llevarlo con su receta a la farmacia.  - Tambin puede pasar por nuestra oficina durante el horario de atencin regular y Charity fundraiser una tarjeta de cupones de GoodRx.  - Si necesita que su receta se enve electrnicamente a una farmacia diferente, informe a nuestra oficina a travs de MyChart de [place] o por telfono llamando al [phone] y presione la opcin 4.

## 2021-11-12 NOTE — Progress Notes (Signed)
° °  Follow-Up Visit   Subjective  Gabriella Santiago is a 70 y.o. female who presents for the following: Skin Problem (Pt here for treatment of biopsy proven of superficial BCC at the right forearm ).  The following portions of the chart were reviewed this encounter and updated as appropriate:   Tobacco   Allergies   Meds   Problems   Med Hx   Surg Hx   Fam Hx       Review of Systems:  No other skin or systemic complaints except as noted in HPI or Assessment and Plan.  Objective  Well appearing patient in no apparent distress; mood and affect are within normal limits.  A focused examination was performed including right forearm. Relevant physical exam findings are noted in the Assessment and Plan.  Right Forearm - Anterior Pink pearly papule or plaque with arborizing vessels.    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  Destruction of lesion  Destruction method: electrodesiccation and curettage   Informed consent: discussed and consent obtained   Timeout:  patient name, date of birth, surgical site, and procedure verified Anesthesia: the lesion was anesthetized in a standard fashion   Anesthetic:  1% lidocaine w/ epinephrine 1-100,000 buffered w/ 8.4% NaHCO3 Curettage performed in three different directions: Yes   Electrodesiccation performed over the curetted area: Yes   Curettage cycles:  3 Final wound size (cm):  1.1 Hemostasis achieved with:  electrodesiccation Outcome: patient tolerated procedure well with no complications   Post-procedure details: sterile dressing applied and wound care instructions given   Dressing type: bandage    Discussed ED&C vs topical therapy. Pt prefers ED&C  Return in about 3 months (around 02/10/2022) for TBSE .  I, Marye Round, CMA, am acting as scribe for Forest Gleason, MD .   Documentation: I have reviewed the above documentation for accuracy and completeness, and I agree with the above.  Forest Gleason, MD

## 2021-11-12 NOTE — Progress Notes (Signed)
Entered in error

## 2021-11-19 DIAGNOSIS — H43813 Vitreous degeneration, bilateral: Secondary | ICD-10-CM | POA: Diagnosis not present

## 2021-11-19 DIAGNOSIS — H524 Presbyopia: Secondary | ICD-10-CM | POA: Diagnosis not present

## 2022-01-30 ENCOUNTER — Encounter: Payer: Self-pay | Admitting: Dermatology

## 2022-01-30 ENCOUNTER — Other Ambulatory Visit: Payer: Self-pay

## 2022-01-30 ENCOUNTER — Ambulatory Visit (INDEPENDENT_AMBULATORY_CARE_PROVIDER_SITE_OTHER): Payer: Medicare Other | Admitting: Dermatology

## 2022-01-30 DIAGNOSIS — D1800 Hemangioma unspecified site: Secondary | ICD-10-CM

## 2022-01-30 DIAGNOSIS — L821 Other seborrheic keratosis: Secondary | ICD-10-CM

## 2022-01-30 DIAGNOSIS — Z85828 Personal history of other malignant neoplasm of skin: Secondary | ICD-10-CM | POA: Diagnosis not present

## 2022-01-30 DIAGNOSIS — L578 Other skin changes due to chronic exposure to nonionizing radiation: Secondary | ICD-10-CM

## 2022-01-30 DIAGNOSIS — B372 Candidiasis of skin and nail: Secondary | ICD-10-CM | POA: Diagnosis not present

## 2022-01-30 DIAGNOSIS — Z1283 Encounter for screening for malignant neoplasm of skin: Secondary | ICD-10-CM

## 2022-01-30 DIAGNOSIS — L814 Other melanin hyperpigmentation: Secondary | ICD-10-CM

## 2022-01-30 DIAGNOSIS — D229 Melanocytic nevi, unspecified: Secondary | ICD-10-CM

## 2022-01-30 MED ORDER — KETOCONAZOLE 2 % EX CREA: TOPICAL_CREAM | CUTANEOUS | 2 refills | Status: DC

## 2022-01-30 NOTE — Progress Notes (Signed)
? ?  Follow-Up Visit ?  ?Subjective  ?Gabriella Santiago is a 71 y.o. female who presents for the following: Annual Exam (Here for skin cancer screening. Full body. HxBCC). ? ?The patient presents for Total-Body Skin Exam (TBSE) for skin cancer screening and mole check.  The patient has spots, moles and lesions to be evaluated, some may be new or changing and the patient has concerns that these could be cancer. ? ?The following portions of the chart were reviewed this encounter and updated as appropriate:  Tobacco  Allergies  Meds  Problems  Med Hx  Surg Hx  Fam Hx   ?  ? ?Review of Systems: No other skin or systemic complaints except as noted in HPI or Assessment and Plan. ? ? ?Objective  ?Well appearing patient in no apparent distress; mood and affect are within normal limits. ? ?A full examination was performed including scalp, head, eyes, ears, nose, lips, neck, chest, axillae, abdomen, back, buttocks, bilateral upper extremities, bilateral lower extremities, hands, feet, fingers, toes, fingernails, and toenails. All findings within normal limits unless otherwise noted below. ? ?abdominal fold ?Erythematous patches with satellite papules ? ? ?Assessment & Plan  ? ?Lentigines ?- Scattered tan macules ?- Due to sun exposure ?- Benign-appearing, observe ?- Recommend daily broad spectrum sunscreen SPF 30+ to sun-exposed areas, reapply every 2 hours as needed. ?- Call for any changes ? ?Seborrheic Keratoses ?- Stuck-on, waxy, tan-brown papules and/or plaques  ?- Benign-appearing ?- Discussed benign etiology and prognosis. ?- Observe ?- Call for any changes ? ?Melanocytic Nevi ?- Tan-brown and/or pink-flesh-colored symmetric macules and papules ?- Benign appearing on exam today ?- Observation ?- Call clinic for new or changing moles ?- Recommend daily use of broad spectrum spf 30+ sunscreen to sun-exposed areas.  ? ?Hemangiomas ?- Red papules ?- Discussed benign nature ?- Observe ?- Call for any changes ? ?Actinic  Damage ?- Chronic condition, secondary to cumulative UV/sun exposure ?- diffuse scaly erythematous macules with underlying dyspigmentation ?- Recommend daily broad spectrum sunscreen SPF 30+ to sun-exposed areas, reapply every 2 hours as needed.  ?- Staying in the shade or wearing long sleeves, sun glasses (UVA+UVB protection) and wide brim hats (4-inch brim around the entire circumference of the hat) are also recommended for sun protection.  ?- Call for new or changing lesions. ? ?History of Basal Cell Carcinoma of the Skin ?- No evidence of recurrence today at right forearm ?- Recommend regular full body skin exams ?- Recommend daily broad spectrum sunscreen SPF 30+ to sun-exposed areas, reapply every 2 hours as needed.  ?- Call if any new or changing lesions are noted between office visits  ? ?Skin cancer screening performed today. ? ?Candidal intertrigo ?abdominal fold ? ?Start Ketoconazole 2% cream twice daily to affected areas as needed. ? ?ketoconazole (NIZORAL) 2 % cream - abdominal fold ?Apply twice daily as needed for rash on abdomen ? ? ?Return for TBSE 6-12 months. ? ?I, Emelia Salisbury, CMA, am acting as scribe for Forest Gleason, MD. ? ?Documentation: I have reviewed the above documentation for accuracy and completeness, and I agree with the above. ? ?Forest Gleason, MD ? ? ?

## 2022-01-30 NOTE — Patient Instructions (Addendum)
Rash on abdomen: Start Ketoconazole 2% cream twice daily to affected areas as needed for rash    Recommend taking Heliocare sun protection supplement daily in sunny weather for additional sun protection. For maximum protection on the sunniest days, you can take up to 2 capsules of regular Heliocare OR take 1 capsule of Heliocare Ultra. For prolonged exposure (such as a full day in the sun), you can repeat your dose of the supplement 4 hours after your first dose. Heliocare can be purchased at [employer], at some [facility] or at [email or web address].     Recommend daily broad spectrum sunscreen SPF 30+ to sun-exposed areas, reapply every 2 hours as needed. Call for new or changing lesions.  Staying in the shade or wearing long sleeves, sun glasses (UVA+UVB protection) and wide brim hats (4-inch brim around the entire circumference of the hat) are also recommended for sun protection.     Melanoma ABCDEs  Melanoma is the most dangerous type of skin cancer, and is the leading cause of death from skin disease.  You are more likely to develop melanoma if you: Have light-colored skin, light-colored eyes, or red or blond hair Spend a lot of time in the sun Tan regularly, either outdoors or in a tanning bed Have had blistering sunburns, especially during childhood Have a close family member who has had a melanoma Have atypical moles or large birthmarks  Early detection of melanoma is key since treatment is typically straightforward and cure rates are extremely high if we catch it early.   The first sign of melanoma is often a change in a mole or a new dark spot.  The ABCDE system is a way of remembering the signs of melanoma.  A for asymmetry:  The two halves do not match. B for border:  The edges of the growth are irregular. C for color:  A mixture of colors are present instead of an even brown color. D for diameter:  Melanomas are usually (but not always) greater than 51mm - the size of  a pencil eraser. E for evolution:  The spot keeps changing in size, shape, and color.  Please check your skin once per month between visits. You can use a small mirror in front and a large mirror behind you to keep an eye on the back side or your body.   If you see any new or changing lesions before your next follow-up, please call to schedule a visit.  Please continue daily skin protection including broad spectrum sunscreen SPF 30+ to sun-exposed areas, reapplying every 2 hours as needed when you're outdoors.   Staying in the shade or wearing long sleeves, sun glasses (UVA+UVB protection) and wide brim hats (4-inch brim around the entire circumference of the hat) are also recommended for sun protection.     If You Need Anything After Your Visit  If you have any questions or concerns for your doctor, please call our main line at [phone] and press option 4 to reach your doctor's medical assistant. If no one answers, please leave a voicemail as directed and we will return your call as soon as possible. Messages left after 4 pm will be answered the following business day.   You may also send [place] a message via [place]. We typically respond to MyChart messages within 1-2 business days.  For prescription refills, please ask your pharmacy to contact our office. Our fax number is [phone].  If you have an urgent issue when the clinic is closed  that cannot wait until the next business day, you can page your doctor at the number below.    Please note that while we do our best to be available for urgent issues outside of office hours, we are not available 24/7.   If you have an urgent issue and are unable to reach [place], you may choose to seek medical care at your doctor's office, retail clinic, urgent care center, or emergency room.  If you have a medical emergency, please immediately call 911 or go to the emergency department.  Pager Numbers  - Dr. Nehemiah Massed: [phone]  - Dr. Laurence Ferrari:  [phone]  - Dr. Nicole Kindred: [phone]  In the event of inclement weather, please call our main line at [phone] for an update on the status of any delays or closures.  Dermatology Medication Tips: Please keep the boxes that topical medications come in in order to help keep track of the instructions about where and how to use these. Pharmacies typically print the medication instructions only on the boxes and not directly on the medication tubes.   If your medication is too expensive, please contact our office at [phone] option 4 or send [place] a message through [place].   We are unable to tell what your co-pay for medications will be in advance as this is different depending on your insurance coverage. However, we may be able to find a substitute medication at lower cost or fill out paperwork to get insurance to cover a needed medication.   If a prior authorization is required to get your medication covered by your insurance company, please allow [place] 1-2 business days to complete this process.  Drug prices often vary depending on where the prescription is filled and some pharmacies may offer cheaper prices.  The website www.goodrx.com contains coupons for medications through different pharmacies. The prices here do not account for what the cost may be with help from insurance (it may be cheaper with your insurance), but the website can give you the price if you did not use any insurance.  - You can print the associated coupon and take it with your prescription to the pharmacy.  - You may also stop by our office during regular business hours and pick up a GoodRx coupon card.  - If you need your prescription sent electronically to a different pharmacy, notify our office through [facility] or by phone at [phone] option 4.     Si Usted Necesita Algo Despus de Su Visita  Tambin puede enviarnos un mensaje a travs de Pharmacist, community. Por lo general respondemos a los mensajes de  MyChart en el transcurso de 1 a 2 das hbiles.  Para renovar recetas, por favor pida a su farmacia que se ponga en contacto con nuestra oficina. Harland Dingwall de fax es [place] [phone].  Si tiene un asunto urgente cuando la clnica est cerrada y que no puede esperar hasta el siguiente da hbil, puede llamar/localizar a su doctor(a) al nmero que aparece a continuacin.   Por favor, tenga en cuenta que aunque hacemos todo lo posible para estar disponibles para asuntos urgentes fuera del horario de [place], no estamos disponibles las 24 horas del da, los 7 das de la [place].   Si tiene un problema urgente y no puede comunicarse con nosotros, puede optar por buscar atencin mdica  en el consultorio de su doctor(a), en una clnica privada, en un centro de atencin urgente o en una sala de emergencias.  Si tiene [employer], por  favor llame inmediatamente al 911 o vaya a la sala de emergencias.  Nmeros de bper  - Dr. Nehemiah Massed: [phone]  - Dra. Moye: [phone]  - Dra. Nicole Kindred: [phone]  En caso de inclemencias del [place], por favor llame a Johnsie Kindred principal al [phone] para una actualizacin sobre el [place] de cualquier retraso o cierre.  Consejos para la medicacin en dermatologa: Por favor, guarde las cajas en las que vienen los medicamentos de uso tpico para ayudarle a seguir las instrucciones sobre dnde y cmo usarlos. Las farmacias generalmente imprimen las instrucciones del medicamento slo en las cajas y no directamente en los tubos del [place].   Si su medicamento es muy caro, por favor, pngase en contacto con Zigmund Daniel llamando al [phone] y presione la opcin 4 o envenos un mensaje a travs de Pharmacist, community.   No podemos decirle cul ser su copago por los medicamentos por adelantado ya que esto es diferente dependiendo de la cobertura de su seguro. Sin embargo, es posible que podamos encontrar un medicamento sustituto a Contractor un formulario para que el seguro cubra el medicamento que se considera necesario.   Si se requiere una autorizacin previa para que su compaa de seguros [place] su medicamento, por favor permtanos de 1 a 2 das hbiles para completar este proceso.  Los precios de los medicamentos varan con frecuencia dependiendo del Environmental consultant de dnde se surte la receta y alguna farmacias pueden ofrecer precios ms baratos.  El sitio web www.goodrx.com tiene cupones para medicamentos de Airline pilot. Los precios aqu no tienen en cuenta lo que podra costar con la ayuda del seguro (puede ser ms barato con su seguro), pero el sitio web puede darle el precio si no utiliz Research scientist (physical sciences).  - Puede imprimir el cupn correspondiente y llevarlo con su receta a la farmacia.  - Tambin puede pasar por nuestra oficina durante el horario de atencin regular y Charity fundraiser una tarjeta de cupones de GoodRx.  - Si necesita que su receta se enve electrnicamente a una farmacia diferente, informe a nuestra oficina a travs de MyChart de [place] o por telfono llamando al [phone] y presione la opcin 4.

## 2022-02-02 ENCOUNTER — Encounter: Payer: Self-pay | Admitting: Dermatology

## 2022-02-18 DIAGNOSIS — R238 Other skin changes: Secondary | ICD-10-CM | POA: Diagnosis not present

## 2022-03-05 DIAGNOSIS — H47323 Drusen of optic disc, bilateral: Secondary | ICD-10-CM | POA: Diagnosis not present

## 2022-05-09 ENCOUNTER — Other Ambulatory Visit: Payer: Self-pay | Admitting: Family Medicine

## 2022-05-09 DIAGNOSIS — Z1231 Encounter for screening mammogram for malignant neoplasm of breast: Secondary | ICD-10-CM

## 2022-05-15 ENCOUNTER — Ambulatory Visit
Admission: RE | Admit: 2022-05-15 | Discharge: 2022-05-15 | Disposition: A | Discharge status: Home / Self Care | Payer: Medicare Other | Source: Ambulatory Visit | Attending: Family Medicine | Admitting: Family Medicine

## 2022-05-15 DIAGNOSIS — Z1231 Encounter for screening mammogram for malignant neoplasm of breast: Secondary | ICD-10-CM

## 2022-05-16 ENCOUNTER — Other Ambulatory Visit: Payer: Self-pay | Admitting: Family Medicine

## 2022-05-16 DIAGNOSIS — R928 Other abnormal and inconclusive findings on diagnostic imaging of breast: Secondary | ICD-10-CM

## 2022-05-20 ENCOUNTER — Ambulatory Visit
Admission: RE | Admit: 2022-05-20 | Discharge: 2022-05-20 | Disposition: A | Discharge status: Home / Self Care | Payer: Medicare Other | Source: Ambulatory Visit | Attending: Family Medicine | Admitting: Family Medicine

## 2022-05-20 DIAGNOSIS — R928 Other abnormal and inconclusive findings on diagnostic imaging of breast: Secondary | ICD-10-CM | POA: Diagnosis not present

## 2022-05-20 DIAGNOSIS — N6001 Solitary cyst of right breast: Secondary | ICD-10-CM | POA: Diagnosis not present

## 2022-06-09 ENCOUNTER — Other Ambulatory Visit: Payer: Self-pay

## 2022-06-10 ENCOUNTER — Ambulatory Visit (INDEPENDENT_AMBULATORY_CARE_PROVIDER_SITE_OTHER): Payer: Medicare Other

## 2022-06-10 VITALS — Ht <= 58 in | Wt 164.0 lb

## 2022-06-10 DIAGNOSIS — Z0000 Encounter for general adult medical examination without abnormal findings: Secondary | ICD-10-CM

## 2022-06-10 NOTE — Patient Instructions (Signed)
Gabriella Santiago , Thank you for taking time to come for your Medicare Wellness Visit. I appreciate your ongoing commitment to your health goals. Please review the following plan we discussed and let me know if I can assist you in the future.   Screening recommendations/referrals: Colonoscopy: 05/18/17 Mammogram: 05/15/22 Bone Density: 07/01/19, ordered on 06/20/21 Recommended yearly ophthalmology/optometry visit for glaucoma screening and checkup Recommended yearly dental visit for hygiene and checkup  Vaccinations: Influenza vaccine: n/d Pneumococcal vaccine: 06/07/19 Tdap vaccine: 03/01/08, due if have an injury Shingles vaccine: n/d   Covid-19:01/11/20, 02/01/20, 12/06/20  Advanced directives: no  Conditions/risks identified: none  Next appointment: Follow up in one year for your annual wellness visit 06/12/23 @ 10:30 am by phone   Preventive Care 65 Years and Older, Female Preventive care refers to lifestyle choices and visits with your health care provider that can promote health and wellness. What does preventive care include? A yearly physical exam. This is also called an annual well check. Dental exams once or twice a year. Routine eye exams. Ask your health care provider how often you should have your eyes checked. Personal lifestyle choices, including: Daily care of your teeth and gums. Regular physical activity. Eating a healthy diet. Avoiding tobacco and drug use. Limiting alcohol use. Practicing safe sex. Taking low-dose aspirin every day. Taking vitamin and mineral supplements as recommended by your health care provider. What happens during an annual well check? The services and screenings done by your health care provider during your annual well check will depend on your age, overall health, lifestyle risk factors, and family history of disease. Counseling  Your health care provider may ask you questions about your: Alcohol use. Tobacco use. Drug use. Emotional  well-being. Home and relationship well-being. Sexual activity. Eating habits. History of falls. Memory and ability to understand (cognition). Work and work Statistician. Reproductive health. Screening  You may have the following tests or measurements: Height, weight, and BMI. Blood pressure. Lipid and cholesterol levels. These may be checked every 5 years, or more frequently if you are over 81 years old. Skin check. Lung cancer screening. You may have this screening every year starting at age 52 if you have a 30-pack-year history of smoking and currently smoke or have quit within the past 15 years. Fecal occult blood test (FOBT) of the stool. You may have this test every year starting at age 56. Flexible sigmoidoscopy or colonoscopy. You may have a sigmoidoscopy every 5 years or a colonoscopy every 10 years starting at age 69. Hepatitis C blood test. Hepatitis B blood test. Sexually transmitted disease (STD) testing. Diabetes screening. This is done by checking your blood sugar (glucose) after you have not eaten for a while (fasting). You may have this done every 1-3 years. Bone density scan. This is done to screen for osteoporosis. You may have this done starting at age 40. Mammogram. This may be done every 1-2 years. Talk to your health care provider about how often you should have regular mammograms. Talk with your health care provider about your test results, treatment options, and if necessary, the need for more tests. Vaccines  Your health care provider may recommend certain vaccines, such as: Influenza vaccine. This is recommended every year. Tetanus, diphtheria, and acellular pertussis (Tdap, Td) vaccine. You may need a Td booster every 10 years. Zoster vaccine. You may need this after age 31. Pneumococcal 13-valent conjugate (PCV13) vaccine. One dose is recommended after age 2. Pneumococcal polysaccharide (PPSV23) vaccine. One dose is recommended  after age 32. Talk to your  health care provider about which screenings and vaccines you need and how often you need them. This information is not intended to replace advice given to you by your health care provider. Make sure you discuss any questions you have with your health care provider. Document Released: 12/14/2015 Document Revised: 08/06/2016 Document Reviewed: 09/18/2015 Elsevier Interactive Patient Education  2017 [place] Prevention in the Home Falls can cause injuries. They can happen to people of all ages. There are many things you can do to make your home safe and to help prevent falls. What can I do on the outside of my home? Regularly fix the edges of walkways and driveways and fix any cracks. Remove anything that might make you trip as you walk through a door, such as a raised step or threshold. Trim any bushes or trees on the path to your home. Use bright outdoor lighting. Clear any walking paths of anything that might make someone trip, such as rocks or tools. Regularly check to see if handrails are loose or broken. Make sure that both sides of any steps have handrails. Any raised decks and porches should have guardrails on the edges. Have any leaves, snow, or ice cleared regularly. Use sand or salt on walking paths during winter. Clean up any spills in your garage right away. This includes oil or grease spills. What can I do in the bathroom? Use night lights. Install grab bars by the toilet and in the tub and shower. Do not use towel bars as grab bars. Use non-skid mats or decals in the tub or shower. If you need to sit down in the shower, use a plastic, non-slip stool. Keep the floor dry. Clean up any water that spills on the floor as soon as it happens. Remove soap buildup in the tub or shower regularly. Attach bath mats securely with double-sided non-slip rug tape. Do not have throw rugs and other things on the floor that can make you trip. What can I do in the bedroom? Use night  lights. Make sure that you have a light by your bed that is easy to reach. Do not use any sheets or blankets that are too big for your bed. They should not hang down onto the floor. Have a firm chair that has side arms. You can use this for support while you get dressed. Do not have throw rugs and other things on the floor that can make you trip. What can I do in the kitchen? Clean up any spills right away. Avoid walking on wet floors. Keep items that you use a lot in easy-to-reach places. If you need to reach something above you, use a strong step stool that has a grab bar. Keep electrical cords out of the way. Do not use floor polish or wax that makes floors slippery. If you must use wax, use non-skid floor wax. Do not have throw rugs and other things on the floor that can make you trip. What can I do with my stairs? Do not leave any items on the stairs. Make sure that there are handrails on both sides of the stairs and use them. Fix handrails that are broken or loose. Make sure that handrails are as long as the stairways. Check any carpeting to make sure that it is firmly attached to the stairs. Fix any carpet that is loose or worn. Avoid having throw rugs at the top or bottom of the stairs. If you  do have throw rugs, attach them to the floor with carpet tape. Make sure that you have a light switch at the top of the stairs and the bottom of the stairs. If you do not have them, ask someone to add them for you. What else can I do to help prevent falls? Wear shoes that: Do not have high heels. Have rubber bottoms. Are comfortable and fit you well. Are closed at the toe. Do not wear sandals. If you use a stepladder: Make sure that it is fully opened. Do not climb a closed stepladder. Make sure that both sides of the stepladder are locked into place. Ask someone to hold it for you, if possible. Clearly mark and make sure that you can see: Any grab bars or handrails. First and last  steps. Where the edge of each step is. Use tools that help you move around (mobility aids) if they are needed. These include: Canes. Walkers. Scooters. Crutches. Turn on the lights when you go into a dark area. Replace any light bulbs as soon as they burn out. Set up your furniture so you have a clear path. Avoid moving your furniture around. If any of your floors are uneven, fix them. If there are any pets around you, be aware of where they are. Review your medicines with your doctor. Some medicines can make you feel dizzy. This can increase your chance of falling. Ask your doctor what other things that you can do to help prevent falls. This information is not intended to replace advice given to you by your health care provider. Make sure you discuss any questions you have with your health care provider. Document Released: 09/13/2009 Document Revised: 04/24/2016 Document Reviewed: 12/22/2014 Elsevier Interactive Patient Education  2017 [employer].

## 2022-06-10 NOTE — Progress Notes (Signed)
Virtual Visit via Telephone Note  I connected with  Gabriella Santiago on 06/10/22 at 11:00 AM EDT by telephone and verified that I am speaking with the correct person using two identifiers.  Location: Patient: home Provider: Maury Persons participating in the virtual visit: [place]   I discussed the limitations, risks, security and privacy concerns of performing an evaluation and management service by telephone and the availability of in person appointments. The patient expressed understanding and agreed to proceed.  Interactive audio and video telecommunications were attempted between this nurse and patient, however failed, due to patient having technical difficulties OR patient did not have access to video capability.  We continued and completed visit with audio only.  Some vital signs may be absent or patient reported.   Gabriella David, LPN  Subjective:   Gabriella Santiago is a 71 y.o. female who presents for Medicare Annual (Subsequent) preventive examination.  Review of Systems     Cardiac Risk Factors include: advanced age (>60mn, >>46women);hypertension     Objective:    There were no vitals filed for this visit. There is no height or weight on file to calculate BMI.     06/10/2022   11:00 AM 06/07/2021   11:23 AM 06/01/2020    3:34 PM 04/01/2019    9:08 AM 05/04/2017    3:27 PM  Advanced Directives  Does Patient Have a Medical Advance Directive? No Yes Yes Yes Yes  Type of ACorporate treasurerof AHugoLiving will HWarrenLiving will HAlcornLiving will HDanburyLiving will  Copy of HAllportin Chart?  No - copy requested No - copy requested No - copy requested   Would patient like information on creating a medical advance directive? No - Patient declined        Current Medications (verified) Outpatient Encounter Medications as of 06/10/2022   Medication Sig   Ascorbic Acid (VITAMIN C PO) Take by mouth.   b complex vitamins capsule Take 1 capsule by mouth daily.   Cholecalciferol (VITAMIN D3) 125 MCG (5000 UT) CAPS Take 1 capsule by mouth daily.    hydrochlorothiazide (HYDRODIURIL) 25 MG tablet    ketoconazole (NIZORAL) 2 % cream Apply twice daily as needed for rash on abdomen   losartan-hydrochlorothiazide (HYZAAR) 100-25 MG tablet Take 1 tablet by mouth daily.   Multiple Vitamins-Minerals (ZINC PO) Take by mouth.   mupirocin ointment (BACTROBAN) 2 % Apply to skin qd-bid   Zinc Sulfate 110 MG TABS Take by mouth.   No facility-administered encounter medications on file as of 06/10/2022.    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   [facility]   Past Surgical History:  Procedure Laterality Date   TUBAL LIGATION     Family History  Problem Relation Age of Onset   Breast cancer Cousin    Colon cancer Neg Hx    Colon polyps Neg Hx    Esophageal cancer Neg Hx    Rectal cancer Neg Hx    Stomach cancer Neg Hx    Social History   Socioeconomic History   Marital status: Single    Spouse name: Not on file   Number of children: Not on file  Years of education: Not on file   Highest education level: Not on file  Occupational History   Not on file  Tobacco Use   Smoking status: Never   Smokeless tobacco: Never  Vaping Use   Vaping Use: Never used  Substance and Sexual Activity   Alcohol use: No    Alcohol/week: 0.0 standard drinks of alcohol   Drug use: No   Sexual activity: Not on file  Other Topics Concern   Not on file  Social History Narrative   Not on file   Social Determinants of Health   Financial Resource Strain: Low Risk  (06/10/2022)   Overall Financial Resource Strain (CARDIA)    Difficulty of Paying Living  Expenses: Not hard at all  Food Insecurity: No Food Insecurity (06/10/2022)   Hunger Vital Sign    Worried About Running Out of Food in the Last Year: Never true    Ran Out of Food in the Last Year: Never true  Transportation Needs: No Transportation Needs (06/10/2022)   PRAPARE - Hydrologist (Medical): No    Lack of Transportation (Non-Medical): No  Physical Activity: Insufficiently Active (06/10/2022)   Exercise Vital Sign    Days of Exercise per Week: 3 days    Minutes of Exercise per Session: 30 min  Stress: No Stress Concern Present (06/10/2022)   [place]    Feeling of Stress : Only a little  Social Connections: Moderately Isolated (06/10/2022)   Social Connection and Isolation Panel [NHANES]    Frequency of Communication with Friends and Family: More than three times a week    Frequency of Social Gatherings with Friends and Family: Once a week    Attends Religious Services: More than 4 times per year    Active Member of [employer] or Organizations: No    Attends Archivist Meetings: Never    Marital Status: Widowed    Tobacco Counseling Counseling given: Not Answered   Clinical Intake:  Pre-visit preparation completed: Yes  Pain : No/denies pain     Nutritional Risks: None Diabetes: No  How often do you need to have someone help you when you read instructions, pamphlets, or other written materials from your doctor or pharmacy?: 1 - Never  Diabetic?no  Interpreter Needed?: No  Information entered by :: Kirke Shaggy, LPN   Activities of Daily Living    06/10/2022   11:01 AM  In your present state of health, do you have any difficulty performing the following activities:  Hearing? 0  Vision? 0  Difficulty concentrating or making decisions? 0  Walking or climbing stairs? 0  Dressing or bathing? 0  Doing errands, shopping? 0  Preparing Food and eating ? N   Using the Toilet? N  In the past six months, have you accidently leaked urine? N  Do you have problems with loss of bowel control? N  Managing your Medications? N  Managing your Finances? N  Housekeeping or managing your Housekeeping? N    Patient Care Team: [place], Wynelle Fanny, MD as PCP - General  Indicate any recent Medical Services you may have received from other than [facility] providers in the past year (date may be approximate).     Assessment:   This is a routine wellness examination for Gabriella Santiago.  Hearing/Vision screen Hearing Screening - Comments:: No aids Vision Screening - Comments:: Wears glasses- [facility]  Dietary issues and exercise activities discussed: Current  Exercise Habits: Home exercise routine, Type of exercise: walking, Time (Minutes): 30, Frequency (Times/Week): 3, Weekly Exercise (Minutes/Week): 90, Intensity: Mild   Goals Addressed             This Visit's Progress    DIET - EAT MORE FRUITS AND VEGETABLES         Depression Screen    06/10/2022   10:58 AM 06/07/2021   11:32 AM 06/01/2020    3:35 PM 04/01/2019    9:08 AM 03/23/2018    3:28 PM 01/31/2013    4:58 PM  PHQ 2/9 Scores  PHQ - 2 Score 0 0 0 0 0 0  PHQ- 9 Score 0 0 0 0      Fall Risk    06/10/2022   11:00 AM 06/07/2021   11:27 AM 06/01/2020    3:35 PM 04/01/2019    9:08 AM 03/23/2018    3:28 PM  Fall Risk   Falls in the past year? 0 0 0 0 No  Number falls in past yr: 0 0 0    Injury with Fall? 0 0 0    Risk for fall due to : No Fall Risks Medication side effect No Fall Risks    Follow up Falls evaluation completed Falls evaluation completed;Falls prevention discussed Falls evaluation completed;Falls prevention discussed      FALL RISK PREVENTION PERTAINING TO THE HOME:  Any stairs in or around the home? No  If so, are there any without handrails? No  Home free of loose throw rugs in walkways, pet beds, electrical cords, etc? Yes  Adequate lighting in your home to reduce risk of  falls? Yes   ASSISTIVE DEVICES UTILIZED TO PREVENT FALLS:  Life alert? No  Use of a cane, walker or w/c? No  Grab bars in the bathroom? Yes  Shower chair or bench in shower? Yes  Elevated toilet seat or a handicapped toilet? No   Cognitive Function:    06/07/2021   11:35 AM 06/01/2020    3:38 PM 04/01/2019    9:23 AM  MMSE - Mini Mental State Exam  Orientation to time '5 5 5  '$ Orientation to Place '5 5 5  '$ Registration '3 3 3  '$ Attention/ Calculation 5 5 0  Recall '3 3 3  '$ Language- name 2 objects   0  Language- repeat '1 1 1  '$ Language- follow 3 step command   0  Language- read & follow direction   0  Write a sentence   0  Copy design   0  Total score   17        06/10/2022   11:02 AM  6CIT Screen  What Year? 0 points  What month? 0 points  What time? 0 points  Count back from 20 0 points  Months in reverse 0 points  Repeat phrase 0 points  Total Score 0 points    Immunizations Immunization History  Administered Date(s) Administered   Influenza Inj Mdck Quad Pf 01/16/2018   Influenza Whole 12/01/1998   Influenza, High Dose Seasonal PF 08/26/2019   Influenza,inj,Quad PF,6+ Mos 10/01/2018   Influenza-Unspecified 10/16/2015   PFIZER(Purple Top)SARS-COV-2 Vaccination 01/11/2020, 02/01/2020, 12/06/2020   Pneumococcal Conjugate-13 03/23/2018   Pneumococcal Polysaccharide-23 06/07/2019   Td 07/25/1998, 03/01/2008    TDAP status: Due, Education has been provided regarding the importance of this vaccine. Advised may receive this vaccine at local pharmacy or Health Dept. Aware to provide a copy of the vaccination record if obtained from local pharmacy  or Health Dept. Verbalized acceptance and understanding.  Flu Vaccine status: Declined, Education has been provided regarding the importance of this vaccine but patient still declined. Advised may receive this vaccine at local pharmacy or Health Dept. Aware to provide a copy of the vaccination record if obtained from local pharmacy or  Health Dept. Verbalized acceptance and understanding.  Pneumococcal vaccine status: Up to date  Covid-19 vaccine status: Completed vaccines  Qualifies for Shingles Vaccine? Yes   Zostavax completed No   Shingrix Completed?: No.    Education has been provided regarding the importance of this vaccine. Patient has been advised to call insurance company to determine out of pocket expense if they have not yet received this vaccine. Advised may also receive vaccine at local pharmacy or Health Dept. Verbalized acceptance and understanding.  Screening Tests Health Maintenance  Topic Date Due   Hepatitis C Screening  Never done   Zoster Vaccines- Shingrix (1 of 2) Never done   COVID-19 Vaccine (4 - Booster for Pfizer series) 01/31/2021   TETANUS/TDAP  06/21/2031 (Originally 03/01/2018)   INFLUENZA VACCINE  07/01/2022   MAMMOGRAM  05/15/2024   COLONOSCOPY (Pts 45-70yr Insurance coverage will need to be confirmed)  05/19/2027   Pneumonia Vaccine 71 Years old  Completed   DEXA SCAN  Completed   HPV VACCINES  Aged Out    Health Maintenance  Health Maintenance Due  Topic Date Due   Hepatitis C Screening  Never done   Zoster Vaccines- Shingrix (1 of 2) Never done   COVID-19 Vaccine (4 - Booster for PConwayseries) 01/31/2021    Colorectal cancer screening: Type of screening: Colonoscopy. Completed 05/18/17. Repeat every 10 years  Mammogram status: Completed 05/15/22. Repeat every year  Bone Density status: Ordered 06/20/21. Pt provided with contact info and advised to call to schedule appt.  Lung Cancer Screening: (Low Dose CT Chest recommended if Age 69-80years, 30 pack-year currently smoking OR have quit w/in 15years.) does not qualify.    Additional Screening:  Hepatitis C Screening: does qualify; Completed no  Vision Screening: Recommended annual ophthalmology exams for early detection of glaucoma and other disorders of the eye. Is the patient up to date with their annual eye exam?   Yes  Who is the provider or what is the name of the office in which the patient attends annual eye exams[facility] provider, would they like to be referred to a provider to establish care? No .   Dental Screening: Recommended annual dental exams for proper oral hygiene  Community Resource Referral / Chronic Care Management: CRR required this visit?  No   CCM required this visit?  No      Plan:     I have personally reviewed and noted the following in the patient's chart:   Medical and social history Use of alcohol, tobacco or illicit drugs  Current medications and supplements including opioid prescriptions.  Functional ability and status Nutritional status Physical activity Advanced directives List of other physicians Hospitalizations, surgeries, and ER visits in previous 12 months Vitals Screenings to include cognitive, depression, and falls Referrals and appointments  In addition, I have reviewed and discussed with patient certain preventive protocols, quality metrics, and best practice recommendations. A written personalized care plan for preventive services as well as general preventive health recommendations were provided to patient.     LDionisio David LPN   74/48/1856  Nurse Notes: none

## 2022-06-15 ENCOUNTER — Telehealth: Payer: Self-pay | Admitting: Family Medicine

## 2022-06-15 DIAGNOSIS — M810 Age-related osteoporosis without current pathological fracture: Secondary | ICD-10-CM

## 2022-06-15 DIAGNOSIS — I10 Essential (primary) hypertension: Secondary | ICD-10-CM

## 2022-06-15 DIAGNOSIS — E559 Vitamin D deficiency, unspecified: Secondary | ICD-10-CM

## 2022-06-15 DIAGNOSIS — E785 Hyperlipidemia, unspecified: Secondary | ICD-10-CM

## 2022-06-15 NOTE — Telephone Encounter (Signed)
-----   Message from Ellamae Sia sent at 06/02/2022  2:51 PM EDT ----- Regarding: Lab orders for Monday, 7.17.23 Patient is scheduled for CPX labs, please order future labs, Thanks , Karna Christmas

## 2022-06-16 ENCOUNTER — Other Ambulatory Visit (INDEPENDENT_AMBULATORY_CARE_PROVIDER_SITE_OTHER): Payer: Medicare Other

## 2022-06-16 DIAGNOSIS — E559 Vitamin D deficiency, unspecified: Secondary | ICD-10-CM | POA: Diagnosis not present

## 2022-06-16 DIAGNOSIS — I10 Essential (primary) hypertension: Secondary | ICD-10-CM

## 2022-06-16 DIAGNOSIS — E785 Hyperlipidemia, unspecified: Secondary | ICD-10-CM

## 2022-06-16 LAB — CBC WITH DIFFERENTIAL/PLATELET
Basophils Absolute: 0 10*3/uL (ref 0.0–0.1)
Basophils Relative: 0.7 % (ref 0.0–3.0)
Eosinophils Absolute: 0.2 10*3/uL (ref 0.0–0.7)
Eosinophils Relative: 2.6 % (ref 0.0–5.0)
HCT: 41.2 % (ref 36.0–46.0)
Hemoglobin: 14.3 g/dL (ref 12.0–15.0)
Lymphocytes Relative: 24 % (ref 12.0–46.0)
Lymphs Abs: 1.5 10*3/uL (ref 0.7–4.0)
MCHC: 34.8 g/dL (ref 30.0–36.0)
MCV: 89.9 fl (ref 78.0–100.0)
Monocytes Absolute: 0.4 10*3/uL (ref 0.1–1.0)
Monocytes Relative: 6.9 % (ref 3.0–12.0)
Neutro Abs: 4.1 10*3/uL (ref 1.4–7.7)
Neutrophils Relative %: 65.8 % (ref 43.0–77.0)
Platelets: 301 10*3/uL (ref 150.0–400.0)
RBC: 4.58 Mil/uL (ref 3.87–5.11)
RDW: 12.6 % (ref 11.5–15.5)
WBC: 6.3 10*3/uL (ref 4.0–10.5)

## 2022-06-16 LAB — COMPREHENSIVE METABOLIC PANEL
ALT: 15 U/L (ref 0–35)
AST: 16 U/L (ref 0–37)
Albumin: 4.2 g/dL (ref 3.5–5.2)
Alkaline Phosphatase: 63 U/L (ref 39–117)
BUN: 19 mg/dL (ref 6–23)
CO2: 32 mEq/L (ref 19–32)
Calcium: 9.4 mg/dL (ref 8.4–10.5)
Chloride: 101 mEq/L (ref 96–112)
Creatinine, Ser: 0.89 mg/dL (ref 0.40–1.20)
GFR: 66.16 mL/min (ref >60.00)
Glucose, Bld: 95 mg/dL (ref 70–99)
Potassium: 3.4 mEq/L — ABNORMAL LOW (ref 3.5–5.1)
Sodium: 141 mEq/L (ref 135–145)
Total Bilirubin: 0.8 mg/dL (ref 0.2–1.2)
Total Protein: 6.1 g/dL (ref 6.0–8.3)

## 2022-06-16 LAB — LIPID PANEL
Cholesterol: 190 mg/dL (ref 0–200)
HDL: 57.7 mg/dL (ref >39.00)
LDL Cholesterol: 104 mg/dL — ABNORMAL HIGH (ref 0–99)
NonHDL: 132.54
Total CHOL/HDL Ratio: 3
Triglycerides: 143 mg/dL (ref 0.0–149.0)
VLDL: 28.6 mg/dL (ref 0.0–40.0)

## 2022-06-16 LAB — TSH: TSH: 1.48 u[IU]/mL (ref 0.35–5.50)

## 2022-06-16 LAB — VITAMIN D 25 HYDROXY (VIT D DEFICIENCY, FRACTURES): VITD: 67.98 ng/mL (ref 30.00–100.00)

## 2022-06-18 ENCOUNTER — Other Ambulatory Visit: Payer: Self-pay | Admitting: Family Medicine

## 2022-06-23 ENCOUNTER — Ambulatory Visit (INDEPENDENT_AMBULATORY_CARE_PROVIDER_SITE_OTHER): Payer: Medicare Other | Admitting: Family Medicine

## 2022-06-23 ENCOUNTER — Encounter: Payer: Self-pay | Admitting: Family Medicine

## 2022-06-23 VITALS — BP 122/80 | HR 88 | Temp 97.4°F | Ht <= 58 in | Wt 166.5 lb

## 2022-06-23 DIAGNOSIS — M810 Age-related osteoporosis without current pathological fracture: Secondary | ICD-10-CM

## 2022-06-23 DIAGNOSIS — E785 Hyperlipidemia, unspecified: Secondary | ICD-10-CM | POA: Diagnosis not present

## 2022-06-23 DIAGNOSIS — E876 Hypokalemia: Secondary | ICD-10-CM

## 2022-06-23 DIAGNOSIS — I10 Essential (primary) hypertension: Secondary | ICD-10-CM

## 2022-06-23 DIAGNOSIS — E559 Vitamin D deficiency, unspecified: Secondary | ICD-10-CM | POA: Diagnosis not present

## 2022-06-23 DIAGNOSIS — E6609 Other obesity due to excess calories: Secondary | ICD-10-CM

## 2022-06-23 DIAGNOSIS — E2839 Other primary ovarian failure: Secondary | ICD-10-CM

## 2022-06-23 DIAGNOSIS — Z6834 Body mass index (BMI) 34.0-34.9, adult: Secondary | ICD-10-CM | POA: Diagnosis not present

## 2022-06-23 MED ORDER — LOSARTAN POTASSIUM-HCTZ 100-25 MG PO TABS
1.0000 | ORAL_TABLET | Freq: Every day | ORAL | 3 refills | Status: DC
Start: 2022-06-23 — End: 2023-06-29

## 2022-06-23 MED ORDER — POTASSIUM CHLORIDE ER 10 MEQ PO TBCR: 10.0000 meq | EXTENDED_RELEASE_TABLET | Freq: Every day | ORAL | 3 refills | Status: DC

## 2022-06-23 NOTE — Assessment & Plan Note (Signed)
bp in fair control at this time  BP Readings from Last 1 Encounters:  06/23/22 122/80   No changes needed Most recent labs reviewed  Disc lifstyle change with low sodium diet and exercise  Plan to continue losartan hct 100-25 mg daily  Px klor con for low K

## 2022-06-23 NOTE — Assessment & Plan Note (Signed)
Vitamin D level is therapeutic with current supplementation Disc importance of this to bone and overall health Level os 67

## 2022-06-23 NOTE — Assessment & Plan Note (Signed)
Due for dexa Ordered and pt will schedule No falls or fractures Taking vit D and level is tx Encouraged walking   Pt declines another course of alendronate for now but will see how bmd looks

## 2022-06-23 NOTE — Assessment & Plan Note (Signed)
Disc goals for lipids and reasons to control them Rev last labs with pt Rev low sat fat diet in detail LDL stable at 104  Radio is stable  Will continue to watch diet

## 2022-06-23 NOTE — Patient Instructions (Addendum)
If you are interested in the new shingles vaccine (Shingrix) - call your local pharmacy to check on coverage and availability  If affordable, get on a wait list at your pharmacy to get the vaccine.  Start the potassium pill once daily  We will re check a level in 1-2 weeks   For cholesterol Avoid red meat/ fried foods/ fatty breakfast meats/ butter, cheese and high fat dairy/ and shellfish     Call [facility] and schedule a bone density test when you can  Please call the location of your choice from the menu below to schedule your Mammogram and/or Bone Density appointment.    [facility]                      Phone:  [phone] [address], [place] [address]                                                             Services: Traditional and 3D Mammogram, [place] Bone Density                 Phone: [phone] [address], [place] [address]    Service: Bone Density ONLY   *this site does NOT perform mammograms  [facility]                        Phone:  [phone] [address], [place] [address]                                            Services:  3D Mammogram and [place] at [facility]   Phone:  [phone]   [address] [place], [place] [address]  Services: 3D Mammogram and Bone Density  [place] [facility])  Phone:  [phone]   [address] [place], [place] [address]                                              Services:  3D Mammogram and Bone  Density

## 2022-06-23 NOTE — Assessment & Plan Note (Signed)
Lab Results  Component Value Date   K 3.4 (L) 06/16/2022   Px klor con 10 meq daily  Re check in 1-2 wk

## 2022-06-23 NOTE — Assessment & Plan Note (Signed)
Discussed how this problem influences overall health and the risks it imposes  Reviewed plan for weight loss with lower calorie diet (via better food choices and also portion control or program like weight watchers) and exercise building up to or more than 30 minutes 5 days per week including some aerobic activity    

## 2022-06-23 NOTE — Assessment & Plan Note (Deleted)
Reviewed health habits including diet and exercise and skin cancer prevention Reviewed appropriate screening tests for age  Also reviewed health mt list, fam hx and immunization status , as well as social and family history   See HPI Labs reviewed  Declines shingrix but may consider later  Mammogram utd dexa ordered/pt to schedule No falls

## 2022-06-23 NOTE — Progress Notes (Signed)
Subjective:    Patient ID: Gabriella Santiago, female    DOB: 1951-03-15, 71 y.o.   MRN: [id number]  HPI Pt presents for annual f/u of chronic medical problems   Wt Readings from Last 3 Encounters:  06/23/22 166 lb 8 oz (75.5 kg)  06/10/22 164 lb (74.4 kg)  06/20/21 164 lb 6 oz (74.6 kg)   34.80 kg/m  Taking care of her parents  Less time to take care of herself   Trying to eat better  Veggies And salmon  Taking vit D   Unable to exercise time wise with elder care  Wants to make time in the future     Immunization History  Administered Date(s) Administered   Influenza Inj Mdck Quad Pf 01/16/2018   Influenza Whole 12/01/1998   Influenza, High Dose Seasonal PF 08/26/2019   Influenza,inj,Quad PF,6+ Mos 10/01/2018   Influenza-Unspecified 10/16/2015   PFIZER(Purple Top)SARS-COV-2 Vaccination 01/11/2020, 02/01/2020, 12/06/2020   Pneumococcal Conjugate-13 03/23/2018   Pneumococcal Polysaccharide-23 06/07/2019   Td 07/25/1998, 03/01/2008   Zoster status : is considering  Unsure yet    Mammogram 05/2022-? Mass R breast /follow up found to be a b9 cyst  Self breast exam: no lumps   Dexa  06/2019 OP  Alendronate course in the past  Falls:none Fractures:none Supplements - vitamin D vitD level is nl at 67 Exercise : walking right now    Colonoscopy 05/2017 with 10 y recall  HTN bp is stable today  No cp or palpitations or headaches or edema  No side effects to medicines  BP Readings from Last 3 Encounters:  06/23/22 122/80  06/20/21 128/68  06/11/20 136/70    Losartan hct 100-25 mg daily   Lab Results  Component Value Date   CREATININE 0.89 06/16/2022   BUN 19 06/16/2022   NA 141 06/16/2022   K 3.4 (L) 06/16/2022   CL 101 06/16/2022   CO2 32 06/16/2022   Estimated Creatinine Clearance: 51.5 mL/min (by C-G formula based on SCr of 0.89 mg/dL).  She likes salads with deep leafy greens  K was down a bit   She takes a brand of vitamin (shackly brand)  B  complex makes her feel better     Hyperlipidemia Lab Results  Component Value Date   CHOL 190 06/16/2022   CHOL 187 06/04/2021   CHOL 187 06/06/2020   Lab Results  Component Value Date   HDL 57.70 06/16/2022   HDL 54.30 06/04/2021   HDL 59.90 06/06/2020   Lab Results  Component Value Date   LDLCALC 104 (H) 06/16/2022   LDLCALC 106 (H) 06/04/2021   LDLCALC 104 (H) 06/06/2020   Lab Results  Component Value Date   TRIG 143.0 06/16/2022   TRIG 138.0 06/04/2021   TRIG 120.0 06/06/2020   Lab Results  Component Value Date   CHOLHDL 3 06/16/2022   CHOLHDL 3 06/04/2021   CHOLHDL 3 06/06/2020   Lab Results  Component Value Date   LDLDIRECT 116.8 01/24/2013    Diet controlled: would like to bring it down more  Quit donuts since having her blood work  One egg per day     Lab Results  Component Value Date   ALT 15 06/16/2022   AST 16 06/16/2022   ALKPHOS 63 06/16/2022   BILITOT 0.8 06/16/2022   Lab Results  Component Value Date   WBC 6.3 06/16/2022   HGB 14.3 06/16/2022   HCT 41.2 06/16/2022   MCV 89.9 06/16/2022   PLT  301.0 06/16/2022   Lab Results  Component Value Date   TSH 1.48 06/16/2022    Patient Active Problem List   Diagnosis Date Noted   Hypokalemia 06/23/2022   Dermatochalasis of both upper eyelids 04/09/2021   Ptosis of both eyelids 04/09/2021   Visual field defects 04/09/2021   Hypertensive disorder 08/28/2020   Welcome to Medicare preventive visit 03/23/2018   Vitamin D deficiency 03/23/2018   Screening mammogram, encounter for 03/17/2017   Estrogen deficiency 03/17/2017   Obesity 05/11/2015   Encounter for routine gynecological examination 01/31/2013   Colon cancer screening 01/31/2013   Routine general medical examination at a health care facility 01/23/2013   Hyperlipidemia, mild 03/01/2008   Essential hypertension 03/01/2008   GERD 03/01/2008   FIBROCYSTIC BREAST DISEASE 03/01/2008   POSTMENOPAUSAL STATUS 03/01/2008    Osteoporosis 03/01/2008   Past Medical History:  Diagnosis Date   Actinic keratosis    Allergy    Anemia    years ago    Hypertension    Superficial basal cell carcinoma 09/19/2021   right forearm, EDC 11/12/2021   Past Surgical History:  Procedure Laterality Date   TUBAL LIGATION     Social History   Tobacco Use   Smoking status: Never   Smokeless tobacco: Never  Vaping Use   Vaping Use: Never used  Substance Use Topics   Alcohol use: No    Alcohol/week: 0.0 standard drinks of alcohol   Drug use: No   Family History  Problem Relation Age of Onset   Breast cancer Cousin    Colon cancer Neg Hx    Colon polyps Neg Hx    Esophageal cancer Neg Hx    Rectal cancer Neg Hx    Stomach cancer Neg Hx    Allergies  Allergen Reactions   Shellfish Allergy Rash   Current Outpatient Medications on File Prior to Visit  Medication Sig Dispense Refill   Ascorbic Acid (VITAMIN C PO) Take by mouth.     b complex vitamins capsule Take 1 capsule by mouth daily.     Cholecalciferol (VITAMIN D3) 125 MCG (5000 UT) CAPS Take 1 capsule by mouth daily.      hydrochlorothiazide (HYDRODIURIL) 25 MG tablet      ketoconazole (NIZORAL) 2 % cream Apply twice daily as needed for rash on abdomen 60 g 2   Multiple Vitamins-Minerals (ZINC PO) Take by mouth.     Zinc Sulfate 110 MG TABS Take by mouth.     No current facility-administered medications on file prior to visit.    Review of Systems  Constitutional:  Negative for activity change, appetite change, fatigue, fever and unexpected weight change.  HENT:  Negative for congestion, ear pain, rhinorrhea, sinus pressure and sore throat.   Eyes:  Negative for pain, redness and visual disturbance.  Respiratory:  Negative for cough, shortness of breath and wheezing.   Cardiovascular:  Negative for chest pain and palpitations.  Gastrointestinal:  Negative for abdominal pain, blood in stool, constipation and diarrhea.  Endocrine: Negative for  polydipsia and polyuria.  Genitourinary:  Negative for dysuria, frequency and urgency.  Musculoskeletal:  Positive for arthralgias. Negative for back pain and myalgias.  Skin:  Negative for pallor and rash.  Allergic/Immunologic: Negative for environmental allergies.  Neurological:  Negative for dizziness, syncope and headaches.  Hematological:  Negative for adenopathy. Does not bruise/bleed easily.  Psychiatric/Behavioral:  Negative for decreased concentration and dysphoric mood. The patient is not nervous/anxious.  Objective:   Physical Exam Constitutional:      General: She is not in acute distress.    Appearance: Normal appearance. She is well-developed. She is obese. She is not ill-appearing or diaphoretic.  HENT:     Head: Normocephalic and atraumatic.     Right Ear: Tympanic membrane, ear canal and external ear normal.     Left Ear: Tympanic membrane, ear canal and external ear normal.     Nose: Nose normal. No congestion.     Mouth/Throat:     Mouth: Mucous membranes are moist.     Pharynx: Oropharynx is clear. No posterior oropharyngeal erythema.  Eyes:     General: No scleral icterus.    Extraocular Movements: Extraocular movements intact.     Conjunctiva/sclera: Conjunctivae normal.     Pupils: Pupils are equal, round, and reactive to light.  Neck:     Thyroid: No thyromegaly.     Vascular: No carotid bruit or JVD.  Cardiovascular:     Rate and Rhythm: Normal rate and regular rhythm.     Pulses: Normal pulses.     Heart sounds: Normal heart sounds.     No gallop.  Pulmonary:     Effort: Pulmonary effort is normal. No respiratory distress.     Breath sounds: Normal breath sounds. No wheezing.     Comments: Good air exch Chest:     Chest wall: No tenderness.  Abdominal:     General: Bowel sounds are normal. There is no distension or abdominal bruit.     Palpations: Abdomen is soft. There is no mass.     Tenderness: There is no abdominal tenderness.      Hernia: No hernia is present.  Genitourinary:    Comments: Breast exam: No mass, nodules, thickening, tenderness, bulging, retraction, inflamation, nipple discharge or skin changes noted.  No axillary or clavicular LA.     Musculoskeletal:        General: No tenderness. Normal range of motion.     Cervical back: Normal range of motion and neck supple. No rigidity. No muscular tenderness.     Right lower leg: No edema.     Left lower leg: No edema.     Comments: Mild kyphosis   Lymphadenopathy:     Cervical: No cervical adenopathy.  Skin:    General: Skin is warm and dry.     Coloration: Skin is not pale.     Findings: No erythema or rash.     Comments: Fair Solar lentigines diffusely   Neurological:     Mental Status: She is alert. Mental status is at baseline.     Cranial Nerves: No cranial nerve deficit.     Motor: No abnormal muscle tone.     Coordination: Coordination normal.     Gait: Gait normal.     Deep Tendon Reflexes: Reflexes are normal and symmetric. Reflexes normal.  Psychiatric:        Mood and Affect: Mood normal.        Cognition and Memory: Cognition and memory normal.           Assessment & Plan:    Problem List Items Addressed This Visit       Cardiovascular and Mediastinum   Essential hypertension - Primary    bp in fair control at this time  BP Readings from Last 1 Encounters:  06/23/22 122/80  No changes needed Most recent labs reviewed  Disc lifstyle change with low sodium diet and exercise  Plan to continue losartan hct 100-25 mg daily  Px klor con for low K      Relevant Medications   losartan-hydrochlorothiazide (HYZAAR) 100-25 MG tablet     Musculoskeletal and Integument   Osteoporosis    Due for dexa Ordered and pt will schedule No falls or fractures Taking vit D and level is tx Encouraged walking   Pt declines another course of alendronate for now but will see how bmd looks        Other   Estrogen deficiency   Relevant  Orders   DG Bone Density   Hyperlipidemia, mild    Disc goals for lipids and reasons to control them Rev last labs with pt Rev low sat fat diet in detail LDL stable at 104  Radio is stable  Will continue to watch diet       Relevant Medications   losartan-hydrochlorothiazide (HYZAAR) 100-25 MG tablet   Hypokalemia    Lab Results  Component Value Date   K 3.4 (L) 06/16/2022  Px klor con 10 meq daily  Re check in 1-2 wk      Relevant Orders   Basic metabolic panel   Obesity    Discussed how this problem influences overall health and the risks it imposes  Reviewed plan for weight loss with lower calorie diet (via better food choices and also portion control or program like weight watchers) and exercise building up to or more than 30 minutes 5 days per week including some aerobic activity         Vitamin D deficiency    Vitamin D level is therapeutic with current supplementation Disc importance of this to bone and overall health Level os 67

## 2022-07-07 ENCOUNTER — Other Ambulatory Visit (INDEPENDENT_AMBULATORY_CARE_PROVIDER_SITE_OTHER): Payer: Medicare Other

## 2022-07-07 DIAGNOSIS — E876 Hypokalemia: Secondary | ICD-10-CM

## 2022-07-07 LAB — BASIC METABOLIC PANEL
BUN: 16 mg/dL (ref 6–23)
CO2: 29 mEq/L (ref 19–32)
Calcium: 9.5 mg/dL (ref 8.4–10.5)
Chloride: 101 mEq/L (ref 96–112)
Creatinine, Ser: 0.93 mg/dL (ref 0.40–1.20)
GFR: 62.74 mL/min (ref >60.00)
Glucose, Bld: 89 mg/dL (ref 70–99)
Potassium: 3.6 mEq/L (ref 3.5–5.1)
Sodium: 139 mEq/L (ref 135–145)

## 2022-07-29 ENCOUNTER — Ambulatory Visit (INDEPENDENT_AMBULATORY_CARE_PROVIDER_SITE_OTHER): Payer: Medicare Other | Admitting: Family Medicine

## 2022-07-29 ENCOUNTER — Encounter: Payer: Self-pay | Admitting: Family Medicine

## 2022-07-29 VITALS — BP 122/70 | HR 92 | Temp 97.3°F | Ht <= 58 in | Wt 163.0 lb

## 2022-07-29 DIAGNOSIS — Z636 Dependent relative needing care at home: Secondary | ICD-10-CM

## 2022-07-29 DIAGNOSIS — G8929 Other chronic pain: Secondary | ICD-10-CM | POA: Diagnosis not present

## 2022-07-29 DIAGNOSIS — M545 Low back pain, unspecified: Secondary | ICD-10-CM | POA: Diagnosis not present

## 2022-07-29 MED ORDER — MELOXICAM 15 MG PO TABS: 15.0000 mg | ORAL_TABLET | Freq: Every day | ORAL | 0 refills | Status: DC | PRN

## 2022-07-29 NOTE — Assessment & Plan Note (Signed)
Suspect strain after catching her father in a fall Bilateral  Reassuring exam/no neuro changes  Disc red flags/ ER precautions   Adv use of heat Rehab handout given  meloxicam 15 mg with food prn  Walking  Massage Update if not starting to improve in a week or if worsening   Has done PT in past/ 2020

## 2022-07-29 NOTE — Patient Instructions (Addendum)
Use gentle heat on your back for 15 minutes at a time  Get massage  Keep walking  Try the stretches in the handout (sciatica rehab)   Try meloxicam 15 mg daily as needed with food  Tylenol is ok if you need it as well   Back brace is ok for short periods   Update if not starting to improve in a week or if worsening

## 2022-07-29 NOTE — Progress Notes (Signed)
Subjective:    Patient ID: Gabriella Santiago, female    DOB: 09/19/1951, 71 y.o.   MRN: [id number]  HPI Pt presents with back pain   Wt Readings from Last 3 Encounters:  07/29/22 163 lb (73.9 kg)  06/23/22 166 lb 8 oz (75.5 kg)  06/10/22 164 lb (74.4 kg)   34.07 kg/m  H/o low back pain /sciatica Has done PT in the past - it helped /during pandemic   Pt had to catch her father when he fell  Felt her back catch   Low back  Both side  Sharp when moves a certain way  Has not radiated down legs   Has been walking   Most comfortable: sitting   Least comfortable- getting up / getting out of bed   Has not had a massage since this happened  Has one planned tomorrow (going for years)    Tearful Parents are in 47s and 90s- failing  Has help of 1 sib  Hard to watch Getting palliative care inv   Patient Active Problem List   Diagnosis Date Noted   Caregiver stress 07/29/2022   Hypokalemia 06/23/2022   Dermatochalasis of both upper eyelids 04/09/2021   Ptosis of both eyelids 04/09/2021   Visual field defects 04/09/2021   Hypertensive disorder 08/28/2020   Low back pain 06/07/2019   Welcome to Medicare preventive visit 03/23/2018   Vitamin D deficiency 03/23/2018   Screening mammogram, encounter for 03/17/2017   Estrogen deficiency 03/17/2017   Obesity 05/11/2015   Encounter for routine gynecological examination 01/31/2013   Colon cancer screening 01/31/2013   Routine general medical examination at a health care facility 01/23/2013   Hyperlipidemia, mild 03/01/2008   Essential hypertension 03/01/2008   GERD 03/01/2008   FIBROCYSTIC BREAST DISEASE 03/01/2008   POSTMENOPAUSAL STATUS 03/01/2008   Osteoporosis 03/01/2008   Past Medical History:  Diagnosis Date   Actinic keratosis    Allergy    Anemia    years ago    Hypertension    Superficial basal cell carcinoma 09/19/2021   right forearm, EDC 11/12/2021   Past Surgical History:  Procedure Laterality Date    TUBAL LIGATION     Social History   Tobacco Use   Smoking status: Never   Smokeless tobacco: Never  Vaping Use   Vaping Use: Never used  Substance Use Topics   Alcohol use: No    Alcohol/week: 0.0 standard drinks of alcohol   Drug use: No   Family History  Problem Relation Age of Onset   Breast cancer Cousin    Colon cancer Neg Hx    Colon polyps Neg Hx    Esophageal cancer Neg Hx    Rectal cancer Neg Hx    Stomach cancer Neg Hx    Allergies  Allergen Reactions   Shellfish Allergy Rash   Current Outpatient Medications on File Prior to Visit  Medication Sig Dispense Refill   Ascorbic Acid (VITAMIN C PO) Take by mouth.     b complex vitamins capsule Take 1 capsule by mouth daily.     Cholecalciferol (VITAMIN D3) 125 MCG (5000 UT) CAPS Take 1 capsule by mouth daily.      hydrochlorothiazide (HYDRODIURIL) 25 MG tablet      ketoconazole (NIZORAL) 2 % cream Apply twice daily as needed for rash on abdomen 60 g 2   losartan-hydrochlorothiazide (HYZAAR) 100-25 MG tablet Take 1 tablet by mouth daily. 90 tablet 3   Multiple Vitamins-Minerals (ZINC PO) Take by mouth.  potassium chloride (KLOR-CON 10) 10 MEQ tablet Take 1 tablet (10 mEq total) by mouth daily. 30 tablet 3   Zinc Sulfate 110 MG TABS Take by mouth.     No current facility-administered medications on file prior to visit.     Review of Systems  Constitutional:  Negative for activity change, appetite change, fatigue, fever and unexpected weight change.  HENT:  Negative for congestion, ear pain, rhinorrhea, sinus pressure and sore throat.   Eyes:  Negative for pain, redness and visual disturbance.  Respiratory:  Negative for cough, shortness of breath and wheezing.   Cardiovascular:  Negative for chest pain and palpitations.  Gastrointestinal:  Negative for abdominal pain, blood in stool, constipation and diarrhea.  Endocrine: Negative for polydipsia and polyuria.  Genitourinary:  Negative for dysuria, frequency  and urgency.  Musculoskeletal:  Positive for back pain. Negative for arthralgias and myalgias.  Skin:  Negative for pallor and rash.  Allergic/Immunologic: Negative for environmental allergies.  Neurological:  Negative for dizziness, syncope and headaches.  Hematological:  Negative for adenopathy. Does not bruise/bleed easily.  Psychiatric/Behavioral:  Negative for decreased concentration and dysphoric mood. The patient is nervous/anxious.        Objective:   Physical Exam Constitutional:      General: She is not in acute distress.    Appearance: Normal appearance. She is well-developed. She is obese. She is not ill-appearing or diaphoretic.  HENT:     Head: Normocephalic and atraumatic.  Eyes:     General: No scleral icterus.    Conjunctiva/sclera: Conjunctivae normal.     Pupils: Pupils are equal, round, and reactive to light.  Cardiovascular:     Rate and Rhythm: Normal rate and regular rhythm.  Pulmonary:     Effort: Pulmonary effort is normal.     Breath sounds: Normal breath sounds. No wheezing or rales.  Abdominal:     General: Bowel sounds are normal. There is no distension.     Palpations: Abdomen is soft.     Tenderness: There is no abdominal tenderness.  Musculoskeletal:        General: Tenderness present.     Cervical back: Normal range of motion and neck supple.     Lumbar back: Spasms and tenderness present. No swelling, edema, deformity, signs of trauma or bony tenderness. Decreased range of motion. Negative right straight leg raise test and negative left straight leg raise test. No scoliosis.     Comments: Bilateral LS muscle tenderness No bony tenderness Flex 90 deg  Ext 10 deg  Pain with R lat bend  Gait is slow but normal     Lymphadenopathy:     Cervical: No cervical adenopathy.  Skin:    General: Skin is warm and dry.     Coloration: Skin is not pale.     Findings: No erythema or rash.  Neurological:     Mental Status: She is alert.     Cranial  Nerves: No cranial nerve deficit.     Sensory: No sensory deficit.     Motor: No weakness, atrophy or abnormal muscle tone.     Coordination: Coordination normal.     Deep Tendon Reflexes: Reflexes are normal and symmetric.     Comments: Negative SLR  Psychiatric:        Mood and Affect: Mood is anxious. Affect is tearful.     Comments: Candidly discusses symptoms and stressors  Tearful at times  Assessment & Plan:   Problem List Items Addressed This Visit       Other   Caregiver stress    Both parents' health is failing Has some family support  Getting palliative care inv Enc good self care when she can  Can refer for counseling if needed       Low back pain - Primary    Suspect strain after catching her father in a fall Bilateral  Reassuring exam/no neuro changes  Disc red flags/ ER precautions   Adv use of heat Rehab handout given  meloxicam 15 mg with food prn  Walking  Massage Update if not starting to improve in a week or if worsening   Has done PT in past/ 2020      Relevant Medications   meloxicam (MOBIC) 15 MG tablet

## 2022-07-29 NOTE — Assessment & Plan Note (Signed)
Both parents' health is failing Has some family support  Getting palliative care inv Enc good self care when she can  Can refer for counseling if needed

## 2022-08-25 ENCOUNTER — Other Ambulatory Visit: Payer: Self-pay | Admitting: Family Medicine

## 2022-08-26 NOTE — Telephone Encounter (Signed)
Called patient states that she feels much better but would like refill to use as needed for occasional pain that she still has.

## 2022-09-18 ENCOUNTER — Other Ambulatory Visit: Payer: Self-pay | Admitting: Dermatology

## 2022-09-18 ENCOUNTER — Other Ambulatory Visit: Payer: Self-pay | Admitting: Family Medicine

## 2022-09-18 DIAGNOSIS — B372 Candidiasis of skin and nail: Secondary | ICD-10-CM

## 2022-09-25 ENCOUNTER — Ambulatory Visit (INDEPENDENT_AMBULATORY_CARE_PROVIDER_SITE_OTHER): Payer: Medicare Other | Admitting: Dermatology

## 2022-09-25 ENCOUNTER — Encounter: Payer: Medicare Other | Admitting: Dermatology

## 2022-09-25 DIAGNOSIS — Z1283 Encounter for screening for malignant neoplasm of skin: Secondary | ICD-10-CM | POA: Diagnosis not present

## 2022-09-25 DIAGNOSIS — D2371 Other benign neoplasm of skin of right lower limb, including hip: Secondary | ICD-10-CM | POA: Diagnosis not present

## 2022-09-25 DIAGNOSIS — L814 Other melanin hyperpigmentation: Secondary | ICD-10-CM | POA: Diagnosis not present

## 2022-09-25 DIAGNOSIS — L821 Other seborrheic keratosis: Secondary | ICD-10-CM

## 2022-09-25 DIAGNOSIS — D229 Melanocytic nevi, unspecified: Secondary | ICD-10-CM

## 2022-09-25 DIAGNOSIS — L578 Other skin changes due to chronic exposure to nonionizing radiation: Secondary | ICD-10-CM | POA: Diagnosis not present

## 2022-09-25 DIAGNOSIS — Z85828 Personal history of other malignant neoplasm of skin: Secondary | ICD-10-CM | POA: Diagnosis not present

## 2022-09-25 NOTE — Progress Notes (Signed)
   Follow-Up Visit   Subjective  Gabriella Santiago is a 71 y.o. female who presents for the following: FBSE (Hx BCC).  The patient presents for Total-Body Skin Exam (TBSE) for skin cancer screening and mole check.  The patient has spots, moles and lesions to be evaluated, some may be new or changing and the patient has concerns that these could be cancer.   The following portions of the chart were reviewed this encounter and updated as appropriate:   Tobacco  Allergies  Meds  Problems  Med Hx  Surg Hx  Fam Hx      Review of Systems:  No other skin or systemic complaints except as noted in HPI or Assessment and Plan.  Objective  Well appearing patient in no apparent distress; mood and affect are within normal limits.  A full examination was performed including scalp, head, eyes, ears, nose, lips, neck, chest, axillae, abdomen, back, buttocks, bilateral upper extremities, bilateral lower extremities, hands, feet, fingers, toes, fingernails, and toenails. All findings within normal limits unless otherwise noted below.    Assessment & Plan  Skin cancer screening   History of Basal Cell Carcinoma of the Skin - No evidence of recurrence today - Recommend regular full body skin exams - Recommend daily broad spectrum sunscreen SPF 30+ to sun-exposed areas, reapply every 2 hours as needed.  - Call if any new or changing lesions are noted between office visits  Lentigines - Scattered tan macules - Due to sun exposure - Benign-appearing, observe - Recommend daily broad spectrum sunscreen SPF 30+ to sun-exposed areas, reapply every 2 hours as needed. - Call for any changes  Seborrheic Keratoses - Stuck-on, waxy, tan-brown papules and/or plaques  - Benign-appearing - Discussed benign etiology and prognosis. - Observe - Call for any changes  Melanocytic Nevi - Tan-brown and/or pink-flesh-colored symmetric macules and papules - Benign appearing on exam today - Observation -  Call clinic for new or changing moles - Recommend daily use of broad spectrum spf 30+ sunscreen to sun-exposed areas.   Hemangiomas - Red papules - Discussed benign nature - Observe - Call for any changes  Actinic Damage - Chronic condition, secondary to cumulative UV/sun exposure - diffuse scaly erythematous macules with underlying dyspigmentation - Recommend daily broad spectrum sunscreen SPF 30+ to sun-exposed areas, reapply every 2 hours as needed.  - Staying in the shade or wearing long sleeves, sun glasses (UVA+UVB protection) and wide brim hats (4-inch brim around the entire circumference of the hat) are also recommended for sun protection.  - Call for new or changing lesions.  Skin cancer screening performed today.  Dermatofibroma - Firm pink/brown papulenodule with dimple sign at right knee - Benign appearing - Call for any changes  Return in about 6 months (around 03/27/2023) for TBSE, Hx BCC.  Graciella Belton, RMA, am acting as scribe for Forest Gleason, MD .  Documentation: I have reviewed the above documentation for accuracy and completeness, and I agree with the above.  Forest Gleason, MD

## 2022-09-25 NOTE — Patient Instructions (Signed)
Recommend taking Heliocare sun protection supplement daily in sunny weather for additional sun protection. For maximum protection on the sunniest days, you can take up to 2 capsules of regular Heliocare OR take 1 capsule of Heliocare Ultra. For prolonged exposure (such as a full day in the sun), you can repeat your dose of the supplement 4 hours after your first dose. Heliocare can be purchased at [facility], at some [facility] or at www.heliocare.com.    Melanoma ABCDEs  Melanoma is the most dangerous type of skin cancer, and is the leading cause of death from skin disease.  You are more likely to develop melanoma if you: Have light-colored skin, light-colored eyes, or red or blond hair Spend a lot of time in the sun Tan regularly, either outdoors or in a tanning bed Have had blistering sunburns, especially during childhood Have a close family member who has had a melanoma Have atypical moles or large birthmarks  Early detection of melanoma is key since treatment is typically straightforward and cure rates are extremely high if we catch it early.   The first sign of melanoma is often a change in a mole or a new dark spot.  The ABCDE system is a way of remembering the signs of melanoma.  A for asymmetry:  The two halves do not match. B for border:  The edges of the growth are irregular. C for color:  A mixture of colors are present instead of an even brown color. D for diameter:  Melanomas are usually (but not always) greater than 6mm - the size of a pencil eraser. E for evolution:  The spot keeps changing in size, shape, and color.  Please check your skin once per month between visits. You can use a small mirror in front and a large mirror behind you to keep an eye on the back side or your body.   If you see any new or changing lesions before your next follow-up, please call to schedule a visit.  Please continue daily skin protection including broad spectrum sunscreen SPF 30+ to  sun-exposed areas, reapplying every 2 hours as needed when you're outdoors.    Due to recent changes in healthcare laws, you may see results of your pathology and/or laboratory studies on MyChart before the doctors have had a chance to review them. We understand that in some cases there may be results that are confusing or concerning to you. Please understand that not all results are received at the same time and often the doctors may need to interpret multiple results in order to provide you with the best plan of care or course of treatment. Therefore, we ask that you please give us 2 business days to thoroughly review all your results before contacting the office for clarification. Should we see a critical lab result, you will be contacted sooner.   If You Need Anything After Your Visit  If you have any questions or concerns for your doctor, please call our main line at [phone] and press option 4 to reach your doctor's medical assistant. If no one answers, please leave a voicemail as directed and we will return your call as soon as possible. Messages left after 4 pm will be answered the following business day.   You may also send us a message via MyChart. We typically respond to MyChart messages within 1-2 business days.  For prescription refills, please ask your pharmacy to contact our office. Our fax number is [phone].  If you have   an urgent issue when the clinic is closed that cannot wait until the next business day, you can page your doctor at the number below.    Please note that while we do our best to be available for urgent issues outside of office hours, we are not available 24/7.   If you have an urgent issue and are unable to reach us, you may choose to seek medical care at your doctor's office, retail clinic, urgent care center, or emergency room.  If you have a medical emergency, please immediately call 911 or go to the emergency department.  Pager Numbers  - Dr.  Kowalski: [phone]  - Dr. Moye: [phone]  - Dr. Stewart: [phone]  In the event of inclement weather, please call our main line at [phone] for an update on the status of any delays or closures.  Dermatology Medication Tips: Please keep the boxes that topical medications come in in order to help keep track of the instructions about where and how to use these. Pharmacies typically print the medication instructions only on the boxes and not directly on the medication tubes.   If your medication is too expensive, please contact our office at [phone] option 4 or send us a message through MyChart.   We are unable to tell what your co-pay for medications will be in advance as this is different depending on your insurance coverage. However, we may be able to find a substitute medication at lower cost or fill out paperwork to get insurance to cover a needed medication.   If a prior authorization is required to get your medication covered by your insurance company, please allow us 1-2 business days to complete this process.  Drug prices often vary depending on where the prescription is filled and some pharmacies may offer cheaper prices.  The website www.goodrx.com contains coupons for medications through different pharmacies. The prices here do not account for what the cost may be with help from insurance (it may be cheaper with your insurance), but the website can give you the price if you did not use any insurance.  - You can print the associated coupon and take it with your prescription to the pharmacy.  - You may also stop by our office during regular business hours and pick up a GoodRx coupon card.  - If you need your prescription sent electronically to a different pharmacy, notify our office through [place] MyChart or by phone at [phone] option 4.     Si Usted Necesita Algo Despus de Su Visita  Tambin puede enviarnos un mensaje a travs de MyChart. Por lo  general respondemos a los mensajes de MyChart en el transcurso de 1 a 2 das hbiles.  Para renovar recetas, por favor pida a su farmacia que se ponga en contacto con nuestra oficina. Nuestro nmero de fax es el [phone].  Si tiene un asunto urgente cuando la clnica est cerrada y que no puede esperar hasta el siguiente da hbil, puede llamar/localizar a su doctor(a) al nmero que aparece a continuacin.   Por favor, tenga en cuenta que aunque hacemos todo lo posible para estar disponibles para asuntos urgentes fuera del horario de oficina, no estamos disponibles las 24 horas del da, los 7 das de la semana.   Si tiene un problema urgente y no puede comunicarse con nosotros, puede optar por buscar atencin mdica  en el consultorio de su doctor(a), en una clnica privada, en un centro de atencin urgente o en una sala de   emergencias.  Si tiene una emergencia mdica, por favor llame inmediatamente al 911 o vaya a la sala de emergencias.  Nmeros de bper  - Dr. Kowalski: [phone]  - Dra. Moye: [phone]  - Dra. Stewart: [phone]  En caso de inclemencias del tiempo, por favor llame a nuestra lnea principal al [phone] para una actualizacin sobre el estado de cualquier retraso o cierre.  Consejos para la medicacin en dermatologa: Por favor, guarde las cajas en las que vienen los medicamentos de uso tpico para ayudarle a seguir las instrucciones sobre dnde y cmo usarlos. Las farmacias generalmente imprimen las instrucciones del medicamento slo en las cajas y no directamente en los tubos del medicamento.   Si su medicamento es muy caro, por favor, pngase en contacto con nuestra oficina llamando al [phone] y presione la opcin 4 o envenos un mensaje a travs de MyChart.   No podemos decirle cul ser su copago por los medicamentos por adelantado ya que esto es diferente dependiendo de la cobertura de su seguro. Sin embargo, es posible que podamos encontrar un  medicamento sustituto a menor costo o llenar un formulario para que el seguro cubra el medicamento que se considera necesario.   Si se requiere una autorizacin previa para que su compaa de seguros cubra su medicamento, por favor permtanos de 1 a 2 das hbiles para completar este proceso.  Los precios de los medicamentos varan con frecuencia dependiendo del lugar de dnde se surte la receta y alguna farmacias pueden ofrecer precios ms baratos.  El sitio web www.goodrx.com tiene cupones para medicamentos de diferentes farmacias. Los precios aqu no tienen en cuenta lo que podra costar con la ayuda del seguro (puede ser ms barato con su seguro), pero el sitio web puede darle el precio si no utiliz ningn seguro.  - Puede imprimir el cupn correspondiente y llevarlo con su receta a la farmacia.  - Tambin puede pasar por nuestra oficina durante el horario de atencin regular y recoger una tarjeta de cupones de GoodRx.  - Si necesita que su receta se enve electrnicamente a una farmacia diferente, informe a nuestra oficina a travs de MyChart de [place] o por telfono llamando al [phone] y presione la opcin 4.  

## 2022-09-29 ENCOUNTER — Encounter: Payer: Self-pay | Admitting: Dermatology

## 2023-01-07 ENCOUNTER — Encounter: Payer: Self-pay | Admitting: Dermatology

## 2023-01-07 ENCOUNTER — Ambulatory Visit: Payer: Medicare HMO | Admitting: Dermatology

## 2023-01-07 VITALS — BP 158/97 | HR 94

## 2023-01-07 DIAGNOSIS — L814 Other melanin hyperpigmentation: Secondary | ICD-10-CM

## 2023-01-07 DIAGNOSIS — L820 Inflamed seborrheic keratosis: Secondary | ICD-10-CM

## 2023-01-07 DIAGNOSIS — L853 Xerosis cutis: Secondary | ICD-10-CM | POA: Diagnosis not present

## 2023-01-07 NOTE — Progress Notes (Signed)
   Follow-Up Visit   Subjective  Gabriella Santiago is a 72 y.o. female who presents for the following: OTHER (Patient here today concerning itchy spots on back that she noticed 2 weeks ago. She used ketoconazole cream. Patient reports she would like spot at left forearm that she would like checked. ).  The patient has spots, moles and lesions to be evaluated, some may be new or changing and the patient has concerns that these could be cancer.   The following portions of the chart were reviewed this encounter and updated as appropriate:  Tobacco  Allergies  Meds  Problems  Med Hx  Surg Hx  Fam Hx      Review of Systems: No other skin or systemic complaints except as noted in HPI or Assessment and Plan.   Objective  Well appearing patient in no apparent distress; mood and affect are within normal limits.  A focused examination was performed including back, left arm . Relevant physical exam findings are noted in the Assessment and Plan.  back Erythematous stuck-on, waxy papules and plaques   Assessment & Plan  Inflamed seborrheic keratosis back  Symptomatic, irritating at exam , patient defers treatment at this time  Benign-appearing.  Observation.  Call clinic for new or changing lesions.       Seborrheic Keratoses Macular Seborrheic keratosis at left forearm - Stuck-on, waxy, tan-brown papules and/or plaques  - Benign-appearing - Discussed benign etiology and prognosis. - Observe - Call for any changes  Lentigines - Scattered tan macules - Due to sun exposure - Benign-appearing, observe - Recommend daily broad spectrum sunscreen SPF 30+ to sun-exposed areas, reapply every 2 hours as needed. - Call for any changes  Xerosis and Itch - Recommend OTC Gold Bond Rapid Relief Anti-Itch cream (pramoxine + menthol), CeraVe Anti-itch cream or lotion (pramoxine), Sarna lotion (Original- menthol + camphor or Sensitive- pramoxine) or Eucerin 12 hour Itch Relief lotion  (menthol) up to 3 times per day to areas on body that are itchy. - Gentle skin care handout provided  Return for keep follow up as scheduled .  I, Ruthell Rummage, CMA, am acting as scribe for Forest Gleason, MD.  Documentation: I have reviewed the above documentation for accuracy and completeness, and I agree with the above.  Forest Gleason, MD

## 2023-01-07 NOTE — Progress Notes (Signed)
Her bp at the derm office was quite high Has it been high at home?  May need to follow up

## 2023-01-07 NOTE — Patient Instructions (Addendum)
Seborrheic Keratosis  What causes seborrheic keratoses? Seborrheic keratoses are harmless, common skin growths that first appear during adult life.  As time goes by, more growths appear.  Some people may develop a large number of them.  Seborrheic keratoses appear on both covered and uncovered body parts.  They are not caused by sunlight.  The tendency to develop seborrheic keratoses can be inherited.  They vary in color from skin-colored to gray, brown, or even black.  They can be either smooth or have a rough, warty surface.   Seborrheic keratoses are superficial and look as if they were stuck on the skin.  Under the microscope this type of keratosis looks like layers upon layers of skin.  That is why at times the top layer may seem to fall off, but the rest of the growth remains and re-grows.    Treatment Seborrheic keratoses do not need to be treated, but can easily be removed in the office.  Seborrheic keratoses often cause symptoms when they rub on clothing or jewelry.  Lesions can be in the way of shaving.  If they become inflamed, they can cause itching, soreness, or burning.  Removal of a seborrheic keratosis can be accomplished by freezing, burning, or surgery. If any spot bleeds, scabs, or grows rapidly, please return to have it checked, as these can be an indication of a skin cancer.    Recommend OTC Gold Bond Rapid Relief Anti-Itch cream (pramoxine + menthol), CeraVe Anti-itch cream or lotion (pramoxine), Sarna lotion (Original- menthol + camphor or Sensitive- pramoxine) or Eucerin 12 hour Itch Relief lotion (menthol) up to 3 times per day to areas on body that are itchy.   Gentle Skin Care Guide  1. Bathe no more than once a day.  2. Avoid bathing in hot water  3. Use a mild soap like Dove, Vanicream, Cetaphil, CeraVe. Can use Lever 2000 or Cetaphil antibacterial soap  4. Use soap only where you need it. On most days, use it under your arms, between your legs, and on your feet.  Let the water rinse other areas unless visibly dirty.  5. When you get out of the bath/shower, use a towel to gently blot your skin dry, don't rub it.  6. While your skin is still a little damp, apply a moisturizing cream such as Vanicream, CeraVe, Cetaphil, Eucerin, Sarna lotion or plain Vaseline Jelly. For hands apply Neutrogena [place] Hand Cream or Excipial Hand Cream.  7. Reapply moisturizer any time you start to itch or feel dry.  8. Sometimes using free and clear laundry detergents can be helpful. Fabric softener sheets should be avoided. Downy Free & Gentle liquid, or any liquid fabric softener that is free of dyes and perfumes, it acceptable to use  9. If your doctor has given you prescription creams you may apply moisturizers over them          Due to recent changes in healthcare laws, you may see results of your pathology and/or laboratory studies on MyChart before the doctors have had a chance to review them. We understand that in some cases there may be results that are confusing or concerning to you. Please understand that not all results are received at the same time and often the doctors may need to interpret multiple results in order to provide you with the best plan of care or course of treatment. Therefore, we ask that you please give [place] 2 business days to thoroughly review all your results before contacting the  office for clarification. Should we see a critical lab result, you will be contacted sooner.   If You Need Anything After Your Visit  If you have any questions or concerns for your doctor, please call our main line at [phone] and press option 4 to reach your doctor's medical assistant. If no one answers, please leave a voicemail as directed and we will return your call as soon as possible. Messages left after 4 pm will be answered the following business day.   You may also send [place] a message via [place]. We typically respond to MyChart messages within 1-2  business days.  For prescription refills, please ask your pharmacy to contact our office. Our fax number is [phone].  If you have an urgent issue when the clinic is closed that cannot wait until the next business day, you can page your doctor at the number below.    Please note that while we do our best to be available for urgent issues outside of office hours, we are not available 24/7.   If you have an urgent issue and are unable to reach [place], you may choose to seek medical care at your doctor's office, retail clinic, urgent care center, or emergency room.  If you have a medical emergency, please immediately call 911 or go to the emergency department.  Pager Numbers  - Dr. Nehemiah Massed: [phone]  - Dr. Laurence Ferrari: [phone]  - Dr. Nicole Kindred: [phone]  In the event of inclement weather, please call our main line at [phone] for an update on the status of any delays or closures.  Dermatology Medication Tips: Please keep the boxes that topical medications come in in order to help keep track of the instructions about where and how to use these. Pharmacies typically print the medication instructions only on the boxes and not directly on the medication tubes.   If your medication is too expensive, please contact our office at [phone] option 4 or send [place] a message through [place].   We are unable to tell what your co-pay for medications will be in advance as this is different depending on your insurance coverage. However, we may be able to find a substitute medication at lower cost or fill out paperwork to get insurance to cover a needed medication.   If a prior authorization is required to get your medication covered by your insurance company, please allow [place] 1-2 business days to complete this process.  Drug prices often vary depending on where the prescription is filled and some pharmacies may offer cheaper prices.  The website www.goodrx.com contains coupons for medications  through different pharmacies. The prices here do not account for what the cost may be with help from insurance (it may be cheaper with your insurance), but the website can give you the price if you did not use any insurance.  - You can print the associated coupon and take it with your prescription to the pharmacy.  - You may also stop by our office during regular business hours and pick up a GoodRx coupon card.  - If you need your prescription sent electronically to a different pharmacy, notify our office through [facility] or by phone at [phone] option 4.     Si Usted Necesita Algo Despus de Su Visita  Tambin puede enviarnos un mensaje a travs de Pharmacist, community. Por lo general respondemos a los mensajes de MyChart en el transcurso de 1 a 2 das hbiles.  Para renovar recetas, por favor pida a su farmacia  que se ponga en contacto con nuestra oficina. Harland Dingwall de fax es [place] [phone].  Si tiene un asunto urgente cuando la clnica est cerrada y que no puede esperar hasta el siguiente da hbil, puede llamar/localizar a su doctor(a) al nmero que aparece a continuacin.   Por favor, tenga en cuenta que aunque hacemos todo lo posible para estar disponibles para asuntos urgentes fuera del horario de [place], no estamos disponibles las 24 horas del da, los 7 das de la [place].   Si tiene un problema urgente y no puede comunicarse con nosotros, puede optar por buscar atencin mdica  en el consultorio de su doctor(a), en una clnica privada, en un centro de atencin urgente o en una sala de emergencias.  Si tiene Engineering geologist, por favor llame inmediatamente al 911 o vaya a la sala de emergencias.  Nmeros de bper  - Dr. Nehemiah Massed: [phone]  - Dra. Moye: [phone]  - Dra. Nicole Kindred: [phone]  En caso de inclemencias del [place], por favor llame a Johnsie Kindred principal al [phone] para una actualizacin sobre el [place] de cualquier retraso o  cierre.  Consejos para la medicacin en dermatologa: Por favor, guarde las cajas en las que vienen los medicamentos de uso tpico para ayudarle a seguir las instrucciones sobre dnde y cmo usarlos. Las farmacias generalmente imprimen las instrucciones del medicamento slo en las cajas y no directamente en los tubos del [place].   Si su medicamento es muy caro, por favor, pngase en contacto con Zigmund Daniel llamando al [phone] y presione la opcin 4 o envenos un mensaje a travs de Pharmacist, community.   No podemos decirle cul ser su copago por los medicamentos por adelantado ya que esto es diferente dependiendo de la cobertura de su seguro. Sin embargo, es posible que podamos encontrar un medicamento sustituto a Electrical engineer un formulario para que el seguro cubra el medicamento que se considera necesario.   Si se requiere una autorizacin previa para que su compaa de seguros [place] su medicamento, por favor permtanos de 1 a 2 das hbiles para completar este proceso.  Los precios de los medicamentos varan con frecuencia dependiendo del Environmental consultant de dnde se surte la receta y alguna farmacias pueden ofrecer precios ms baratos.  El sitio web www.goodrx.com tiene cupones para medicamentos de Airline pilot. Los precios aqu no tienen en cuenta lo que podra costar con la ayuda del seguro (puede ser ms barato con su seguro), pero el sitio web puede darle el precio si no utiliz Research scientist (physical sciences).  - Puede imprimir el cupn correspondiente y llevarlo con su receta a la farmacia.  - Tambin puede pasar por nuestra oficina durante el horario de atencin regular y Charity fundraiser una tarjeta de cupones de GoodRx.  - Si necesita que su receta se enve electrnicamente a una farmacia diferente, informe a nuestra oficina a travs de MyChart de [place] o por telfono llamando al [phone] y presione la opcin 4.

## 2023-01-08 ENCOUNTER — Telehealth: Payer: Self-pay | Admitting: *Deleted

## 2023-01-08 NOTE — Telephone Encounter (Addendum)
-----   Message from Abner Greenspan, MD sent at 01/07/2023  8:06 PM EST -----    ----- Message ----- From: Alfonso Patten, MD Sent: 01/07/2023  10:16 AM EST To: Abner Greenspan, MD  Her bp at the derm office was quite high Has it been high at home?  May need to follow up

## 2023-01-08 NOTE — Telephone Encounter (Signed)
Pt said she was very anxious about the appt., she had a spot and she wasn't sure what it was and what the doc was going to tell her. Pt said she had also drank coffee that morning too. Pt said she does have a BP machine at home and will check her BP to see how it's running and let us know, if it's still running high she's okay coming in for an appt but she wants to see what reading she gets at home. Pt hasn't checked her BP lately but last time she checked it at home with her BP cuff it was in normal range. Pt will either call us back or send a mychart with BP reading and then PCP can decide if pt needs an appt or to just keep an eye on it at home.

## 2023-01-08 NOTE — Telephone Encounter (Signed)
That sounds fine.  Thanks.

## 2023-01-28 ENCOUNTER — Other Ambulatory Visit: Payer: Self-pay | Admitting: Dermatology

## 2023-01-28 DIAGNOSIS — B372 Candidiasis of skin and nail: Secondary | ICD-10-CM

## 2023-01-30 ENCOUNTER — Other Ambulatory Visit: Payer: Self-pay | Admitting: Family Medicine

## 2023-01-30 NOTE — Telephone Encounter (Signed)
Last filled on 08/26/22 #30 tabs/ 3 refills, CPE was on 06/23/22, Back pain appt was on 07/29/22

## 2023-03-09 DIAGNOSIS — H359 Unspecified retinal disorder: Secondary | ICD-10-CM | POA: Diagnosis not present

## 2023-03-09 DIAGNOSIS — H47323 Drusen of optic disc, bilateral: Secondary | ICD-10-CM | POA: Diagnosis not present

## 2023-03-09 DIAGNOSIS — H43813 Vitreous degeneration, bilateral: Secondary | ICD-10-CM | POA: Diagnosis not present

## 2023-03-09 DIAGNOSIS — H2512 Age-related nuclear cataract, left eye: Secondary | ICD-10-CM | POA: Diagnosis not present

## 2023-03-12 ENCOUNTER — Other Ambulatory Visit: Payer: Self-pay | Admitting: Family Medicine

## 2023-04-01 ENCOUNTER — Ambulatory Visit: Payer: Medicare Other | Admitting: Dermatology

## 2023-04-22 ENCOUNTER — Ambulatory Visit: Payer: Medicare HMO | Admitting: Dermatology

## 2023-04-22 DIAGNOSIS — W57XXXA Bitten or stung by nonvenomous insect and other nonvenomous arthropods, initial encounter: Secondary | ICD-10-CM | POA: Diagnosis not present

## 2023-04-22 DIAGNOSIS — W908XXA Exposure to other nonionizing radiation, initial encounter: Secondary | ICD-10-CM

## 2023-04-22 DIAGNOSIS — X32XXXA Exposure to sunlight, initial encounter: Secondary | ICD-10-CM

## 2023-04-22 DIAGNOSIS — Z85828 Personal history of other malignant neoplasm of skin: Secondary | ICD-10-CM

## 2023-04-22 DIAGNOSIS — L821 Other seborrheic keratosis: Secondary | ICD-10-CM | POA: Diagnosis not present

## 2023-04-22 DIAGNOSIS — L814 Other melanin hyperpigmentation: Secondary | ICD-10-CM | POA: Diagnosis not present

## 2023-04-22 DIAGNOSIS — Z1283 Encounter for screening for malignant neoplasm of skin: Secondary | ICD-10-CM | POA: Diagnosis not present

## 2023-04-22 DIAGNOSIS — L820 Inflamed seborrheic keratosis: Secondary | ICD-10-CM

## 2023-04-22 DIAGNOSIS — D1801 Hemangioma of skin and subcutaneous tissue: Secondary | ICD-10-CM | POA: Diagnosis not present

## 2023-04-22 DIAGNOSIS — L570 Actinic keratosis: Secondary | ICD-10-CM

## 2023-04-22 DIAGNOSIS — L578 Other skin changes due to chronic exposure to nonionizing radiation: Secondary | ICD-10-CM | POA: Diagnosis not present

## 2023-04-22 DIAGNOSIS — D492 Neoplasm of unspecified behavior of bone, soft tissue, and skin: Secondary | ICD-10-CM

## 2023-04-22 MED ORDER — HALOBETASOL PROPIONATE 0.05 % EX OINT: TOPICAL_OINTMENT | CUTANEOUS | 1 refills | Status: DC

## 2023-04-22 NOTE — Progress Notes (Unsigned)
Follow-Up Visit   Subjective  Gabriella Santiago is a 72 y.o. female who presents for the following: Skin Cancer Screening and Full Body Skin Exam  The patient presents for Total-Body Skin Exam (TBSE) for skin cancer screening and mole check. The patient has spots, moles and lesions to be evaluated, some may be new or changing and the patient has concerns that these could be cancer.  Hx BCC. Patient with a few spots at right upper arm to check and one at left arm.   The following portions of the chart were reviewed this encounter and updated as appropriate: medications, allergies, medical history  Review of Systems:  No other skin or systemic complaints except as noted in HPI or Assessment and Plan.  Objective  Well appearing patient in no apparent distress; mood and affect are within normal limits.  A full examination was performed including scalp, head, eyes, ears, nose, lips, neck, chest, axillae, abdomen, back, buttocks, bilateral upper extremities, bilateral lower extremities, hands, feet, fingers, toes, fingernails, and toenails. All findings within normal limits unless otherwise noted below.   Relevant physical exam findings are noted in the Assessment and Plan.  left mid back, right upper back, right lower back Erythematous stuck-on, waxy papule or plaque  Right Forearm Erythematous thin papules/macules with gritty scale.   Left Proximal Forearm 0.7 cm medium brown thin papule    Assessment & Plan   LENTIGINES, SEBORRHEIC KERATOSES, HEMANGIOMAS - Benign normal skin lesions - Benign-appearing - Call for any changes  MELANOCYTIC NEVI - Tan-brown and/or pink-flesh-colored symmetric macules and papules - Benign appearing on exam today - Observation - Call clinic for new or changing moles - Recommend daily use of broad spectrum spf 30+ sunscreen to sun-exposed areas.   ACTINIC DAMAGE - Chronic condition, secondary to cumulative UV/sun exposure - diffuse scaly  erythematous macules with underlying dyspigmentation - Recommend daily broad spectrum sunscreen SPF 30+ to sun-exposed areas, reapply every 2 hours as needed.  - Staying in the shade or wearing long sleeves, sun glasses (UVA+UVB protection) and wide brim hats (4-inch brim around the entire circumference of the hat) are also recommended for sun protection.  - Call for new or changing lesions.  SKIN CANCER SCREENING PERFORMED TODAY.  HISTORY OF BASAL CELL CARCINOMA OF THE SKIN - No evidence of recurrence today - Recommend regular full body skin exams - Recommend daily broad spectrum sunscreen SPF 30+ to sun-exposed areas, reapply every 2 hours as needed.  - Call if any new or changing lesions are noted between office visits  INSECT BITE REACTION Exam: erythematous papule  C/w bite reaction  Treatment Plan: Start halobetasol ointment twice a day as needed up to 2 weeks. Avoid applying to face, groin, and axilla. Use as directed. Long-term use can cause thinning of the skin.    Inflamed seborrheic keratosis left mid back, right upper back, right lower back  Symptomatic, irritating, patient would like treated.  Benign-appearing.  Call clinic for new or changing lesions.   Prior to procedure, discussed risks of blister formation, small wound, skin dyspigmentation, or rare scar following treatment. Recommend Vaseline ointment to treated areas while healing.   Destruction of lesion - left mid back, right upper back, right lower back  Destruction method: cryotherapy   Informed consent: discussed and consent obtained   Lesion destroyed using liquid nitrogen: Yes   Cryotherapy cycles:  2 Outcome: patient tolerated procedure well with no complications   Post-procedure details: wound care instructions given  AK (actinic keratosis) Right Forearm  Actinic keratoses are precancerous spots that appear secondary to cumulative UV radiation exposure/sun exposure over time. They are chronic  with expected duration over 1 year. A portion of actinic keratoses will progress to squamous cell carcinoma of the skin. It is not possible to reliably predict which spots will progress to skin cancer and so treatment is recommended to prevent development of skin cancer.  Recommend daily broad spectrum sunscreen SPF 30+ to sun-exposed areas, reapply every 2 hours as needed.  Recommend staying in the shade or wearing long sleeves, sun glasses (UVA+UVB protection) and wide brim hats (4-inch brim around the entire circumference of the hat). Call for new or changing lesions.  Prior to procedure, discussed risks of blister formation, small wound, skin dyspigmentation, or rare scar following cryotherapy. Recommend Vaseline ointment to treated areas while healing.   Destruction of lesion - Right Forearm  Destruction method: cryotherapy   Informed consent: discussed and consent obtained   Lesion destroyed using liquid nitrogen: Yes   Cryotherapy cycles:  2 Outcome: patient tolerated procedure well with no complications   Post-procedure details: wound care instructions given    Neoplasm of skin Left Proximal Forearm  Epidermal / dermal shaving  Lesion diameter (cm):  0.7 Informed consent: discussed and consent obtained   Timeout: patient name, date of birth, surgical site, and procedure verified   Anesthesia: the lesion was anesthetized in a standard fashion   Anesthetic:  1% lidocaine w/ epinephrine 1-100,000 local infiltration Instrument used: flexible razor blade   Hemostasis achieved with: aluminum chloride   Outcome: patient tolerated procedure well   Post-procedure details: wound care instructions given   Additional details:  Mupirocin and a bandage applied  Specimen 1 - Surgical pathology Differential Diagnosis: r/o Atypia  Check Margins: No 0.7 cm medium brown thin papule    Return for TBSE, Hx BCC, Hx AK in 6-12 months.  Anise Salvo, RMA, am acting as scribe for  Darden Dates, MD .   Documentation: I have reviewed the above documentation for accuracy and completeness, and I agree with the above.  Darden Dates, MD

## 2023-04-22 NOTE — Patient Instructions (Addendum)
Start halobetasol ointment twice daily to affected area (bug bite) as needed for up to 2 weeks. Avoid applying to face, groin, and axilla. Use as directed. Long-term use can cause thinning of the skin.  Topical steroids (such as triamcinolone, fluocinolone, fluocinonide, mometasone, clobetasol, halobetasol, betamethasone, hydrocortisone) can cause thinning and lightening of the skin if they are used for too long in the same area. Your physician has selected the right strength medicine for your problem and area affected on the body. Please use your medication only as directed by your physician to prevent side effects.   Wound Care Instructions  Cleanse wound gently with soap and water once a day then pat dry with clean gauze. Apply a thin coat of Petrolatum (petroleum jelly, "Vaseline") over the wound (unless you have an allergy to this). We recommend that you use a new, sterile tube of Vaseline. Do not pick or remove scabs. Do not remove the yellow or white "healing tissue" from the base of the wound.  Cover the wound with fresh, clean, nonstick gauze and secure with paper tape. You may use Band-Aids in place of gauze and tape if the wound is small enough, but would recommend trimming much of the tape off as there is often too much. Sometimes Band-Aids can irritate the skin.  You should call the office for your biopsy report after 1 week if you have not already been contacted.  If you experience any problems, such as abnormal amounts of bleeding, swelling, significant bruising, significant pain, or evidence of infection, please call the office immediately.  FOR ADULT SURGERY PATIENTS: If you need something for pain relief you may take 1 extra strength Tylenol (acetaminophen) AND 2 Ibuprofen (200mg  each) together every 4 hours as needed for pain. (do not take these if you are allergic to them or if you have a reason you should not take them.) Typically, you may only need pain medication for 1 to 3 days.     Cryotherapy Aftercare  Wash gently with soap and water everyday.   Apply Vaseline and Band-Aid daily until healed.   Recommend taking Heliocare sun protection supplement daily in sunny weather for additional sun protection. For maximum protection on the sunniest days, you can take up to 2 capsules of regular Heliocare OR take 1 capsule of Heliocare Ultra. For prolonged exposure (such as a full day in the sun), you can repeat your dose of the supplement 4 hours after your first dose. Heliocare can be purchased at [employer], at some [facility] or at [email or web address].    Melanoma ABCDEs  Melanoma is the most dangerous type of skin cancer, and is the leading cause of death from skin disease.  You are more likely to develop melanoma if you: Have light-colored skin, light-colored eyes, or red or blond hair Spend a lot of time in the sun Tan regularly, either outdoors or in a tanning bed Have had blistering sunburns, especially during childhood Have a close family member who has had a melanoma Have atypical moles or large birthmarks  Early detection of melanoma is key since treatment is typically straightforward and cure rates are extremely high if we catch it early.   The first sign of melanoma is often a change in a mole or a new dark spot.  The ABCDE system is a way of remembering the signs of melanoma.  A for asymmetry:  The two halves do not match. B for border:  The edges of the growth are irregular. C for  color:  A mixture of colors are present instead of an even brown color. D for diameter:  Melanomas are usually (but not always) greater than 6mm - the size of a pencil eraser. E for evolution:  The spot keeps changing in size, shape, and color.  Please check your skin once per month between visits. You can use a small mirror in front and a large mirror behind you to keep an eye on the back side or your body.   If you see any new or changing lesions before your next  follow-up, please call to schedule a visit.  Please continue daily skin protection including broad spectrum sunscreen SPF 30+ to sun-exposed areas, reapplying every 2 hours as needed when you're outdoors.    Due to recent changes in healthcare laws, you may see results of your pathology and/or laboratory studies on MyChart before the doctors have had a chance to review them. We understand that in some cases there may be results that are confusing or concerning to you. Please understand that not all results are received at the same time and often the doctors may need to interpret multiple results in order to provide you with the best plan of care or course of treatment. Therefore, we ask that you please give [place] 2 business days to thoroughly review all your results before contacting the office for clarification. Should we see a critical lab result, you will be contacted sooner.   If You Need Anything After Your Visit  If you have any questions or concerns for your doctor, please call our main line at [phone] and press option 4 to reach your doctor's medical assistant. If no one answers, please leave a voicemail as directed and we will return your call as soon as possible. Messages left after 4 pm will be answered the following business day.   You may also send [place] a message via MyChart. We typically respond to MyChart messages within 1-2 business days.  For prescription refills, please ask your pharmacy to contact our office. Our fax number is [phone].  If you have an urgent issue when the clinic is closed that cannot wait until the next business day, you can page your doctor at the number below.    Please note that while we do our best to be available for urgent issues outside of office hours, we are not available 24/7.   If you have an urgent issue and are unable to reach [place], you may choose to seek medical care at your doctor's office, retail clinic, urgent care center, or emergency  room.  If you have a medical emergency, please immediately call 911 or go to the emergency department.  Pager Numbers  - Dr. Gwen Pounds: [phone]  - Dr. Neale Burly: [phone]  - Dr. Roseanne Reno: [phone]  In the event of inclement weather, please call our main line at [phone] for an update on the status of any delays or closures.  Dermatology Medication Tips: Please keep the boxes that topical medications come in in order to help keep track of the instructions about where and how to use these. Pharmacies typically print the medication instructions only on the boxes and not directly on the medication tubes.   If your medication is too expensive, please contact our office at [phone] option 4 or send [place] a message through MyChart.   We are unable to tell what your co-pay for medications will be in advance as this is different depending on your insurance coverage. However, we  may be able to find a substitute medication at lower cost or fill out paperwork to get insurance to cover a needed medication.   If a prior authorization is required to get your medication covered by your insurance company, please allow [place] 1-2 business days to complete this process.  Drug prices often vary depending on where the prescription is filled and some pharmacies may offer cheaper prices.  The website www.goodrx.com contains coupons for medications through different pharmacies. The prices here do not account for what the cost may be with help from insurance (it may be cheaper with your insurance), but the website can give you the price if you did not use any insurance.  - You can print the associated coupon and take it with your prescription to the pharmacy.  - You may also stop by our office during regular business hours and pick up a GoodRx coupon card.  - If you need your prescription sent electronically to a different pharmacy, notify our office through [facility] or by phone at [phone]  option 4.

## 2023-04-29 ENCOUNTER — Telehealth: Payer: Self-pay

## 2023-04-29 NOTE — Telephone Encounter (Signed)
-----   Message from Sandi Mealy, MD sent at 04/29/2023 12:27 PM EDT ----- Skin , left proximal forearm PIGMENTED SEBORRHEIC KERATOSIS, EARLY This is a benign growth or "wisdom spot". No additional treatment is needed.     MAs please call. Thank you!

## 2023-04-29 NOTE — Telephone Encounter (Signed)
Patient advised pathology showed benign SK. Gabriella Gutierres S., RMA 

## 2023-06-07 ENCOUNTER — Other Ambulatory Visit: Payer: Self-pay | Admitting: Family Medicine

## 2023-06-17 ENCOUNTER — Other Ambulatory Visit: Payer: Self-pay | Admitting: Family Medicine

## 2023-06-17 NOTE — Telephone Encounter (Signed)
Last filled on 01/30/23 #30 tab/ 3 refills. CPE on 06/29/23

## 2023-06-21 ENCOUNTER — Telehealth: Payer: Self-pay | Admitting: Family Medicine

## 2023-06-21 DIAGNOSIS — I10 Essential (primary) hypertension: Secondary | ICD-10-CM

## 2023-06-21 DIAGNOSIS — E559 Vitamin D deficiency, unspecified: Secondary | ICD-10-CM

## 2023-06-21 DIAGNOSIS — E785 Hyperlipidemia, unspecified: Secondary | ICD-10-CM

## 2023-06-21 DIAGNOSIS — Z131 Encounter for screening for diabetes mellitus: Secondary | ICD-10-CM | POA: Insufficient documentation

## 2023-06-21 DIAGNOSIS — M810 Age-related osteoporosis without current pathological fracture: Secondary | ICD-10-CM

## 2023-06-21 NOTE — Telephone Encounter (Signed)
-----   Message from Lovena Neighbours sent at 06/05/2023  3:17 PM EDT ----- Regarding: Labs for 7.22.24 Please put physical lab orders in future. Thank you, Denny Peon

## 2023-06-22 ENCOUNTER — Other Ambulatory Visit (INDEPENDENT_AMBULATORY_CARE_PROVIDER_SITE_OTHER): Payer: Medicare HMO

## 2023-06-22 DIAGNOSIS — E785 Hyperlipidemia, unspecified: Secondary | ICD-10-CM

## 2023-06-22 DIAGNOSIS — I10 Essential (primary) hypertension: Secondary | ICD-10-CM | POA: Diagnosis not present

## 2023-06-22 DIAGNOSIS — E559 Vitamin D deficiency, unspecified: Secondary | ICD-10-CM | POA: Diagnosis not present

## 2023-06-22 DIAGNOSIS — Z131 Encounter for screening for diabetes mellitus: Secondary | ICD-10-CM | POA: Diagnosis not present

## 2023-06-22 LAB — COMPREHENSIVE METABOLIC PANEL
ALT: 14 U/L (ref 0–35)
AST: 15 U/L (ref 0–37)
Albumin: 4 g/dL (ref 3.5–5.2)
Alkaline Phosphatase: 81 U/L (ref 39–117)
BUN: 15 mg/dL (ref 6–23)
CO2: 30 mEq/L (ref 19–32)
Calcium: 9.6 mg/dL (ref 8.4–10.5)
Chloride: 102 mEq/L (ref 96–112)
Creatinine, Ser: 0.83 mg/dL (ref 0.40–1.20)
GFR: 71.43 mL/min (ref >60.00)
Glucose, Bld: 85 mg/dL (ref 70–99)
Potassium: 4.2 mEq/L (ref 3.5–5.1)
Sodium: 141 mEq/L (ref 135–145)
Total Bilirubin: 0.6 mg/dL (ref 0.2–1.2)
Total Protein: 6.3 g/dL (ref 6.0–8.3)

## 2023-06-22 LAB — CBC WITH DIFFERENTIAL/PLATELET
Basophils Absolute: 0.1 10*3/uL (ref 0.0–0.1)
Basophils Relative: 1 % (ref 0.0–3.0)
Eosinophils Absolute: 0.2 10*3/uL (ref 0.0–0.7)
Eosinophils Relative: 3.5 % (ref 0.0–5.0)
HCT: 40.3 % (ref 36.0–46.0)
Hemoglobin: 13.7 g/dL (ref 12.0–15.0)
Lymphocytes Relative: 28.4 % (ref 12.0–46.0)
Lymphs Abs: 1.7 10*3/uL (ref 0.7–4.0)
MCHC: 34 g/dL (ref 30.0–36.0)
MCV: 90.7 fl (ref 78.0–100.0)
Monocytes Absolute: 0.4 10*3/uL (ref 0.1–1.0)
Monocytes Relative: 5.9 % (ref 3.0–12.0)
Neutro Abs: 3.6 10*3/uL (ref 1.4–7.7)
Neutrophils Relative %: 61.2 % (ref 43.0–77.0)
Platelets: 410 10*3/uL — ABNORMAL HIGH (ref 150.0–400.0)
RBC: 4.44 Mil/uL (ref 3.87–5.11)
RDW: 12.3 % (ref 11.5–15.5)
WBC: 5.9 10*3/uL (ref 4.0–10.5)

## 2023-06-22 LAB — LIPID PANEL
Cholesterol: 183 mg/dL (ref 0–200)
HDL: 58 mg/dL (ref >39.00)
LDL Cholesterol: 106 mg/dL — ABNORMAL HIGH (ref 0–99)
NonHDL: 124.73
Total CHOL/HDL Ratio: 3
Triglycerides: 96 mg/dL (ref 0.0–149.0)
VLDL: 19.2 mg/dL (ref 0.0–40.0)

## 2023-06-22 LAB — HEMOGLOBIN A1C: Hgb A1c MFr Bld: 5 % (ref 4.6–6.5)

## 2023-06-22 LAB — VITAMIN D 25 HYDROXY (VIT D DEFICIENCY, FRACTURES): VITD: 78.99 ng/mL (ref 30.00–100.00)

## 2023-06-22 LAB — TSH: TSH: 1.66 u[IU]/mL (ref 0.35–5.50)

## 2023-06-25 ENCOUNTER — Other Ambulatory Visit: Payer: Self-pay | Admitting: Family Medicine

## 2023-06-25 DIAGNOSIS — Z0000 Encounter for general adult medical examination without abnormal findings: Secondary | ICD-10-CM

## 2023-06-29 ENCOUNTER — Encounter: Payer: Self-pay | Admitting: Family Medicine

## 2023-06-29 ENCOUNTER — Ambulatory Visit (INDEPENDENT_AMBULATORY_CARE_PROVIDER_SITE_OTHER): Payer: Medicare HMO | Admitting: Family Medicine

## 2023-06-29 VITALS — BP 118/80 | HR 98 | Temp 98.0°F | Ht <= 58 in | Wt 150.0 lb

## 2023-06-29 DIAGNOSIS — M810 Age-related osteoporosis without current pathological fracture: Secondary | ICD-10-CM | POA: Diagnosis not present

## 2023-06-29 DIAGNOSIS — E876 Hypokalemia: Secondary | ICD-10-CM

## 2023-06-29 DIAGNOSIS — E2839 Other primary ovarian failure: Secondary | ICD-10-CM

## 2023-06-29 DIAGNOSIS — E785 Hyperlipidemia, unspecified: Secondary | ICD-10-CM | POA: Diagnosis not present

## 2023-06-29 DIAGNOSIS — Z131 Encounter for screening for diabetes mellitus: Secondary | ICD-10-CM

## 2023-06-29 DIAGNOSIS — Z6831 Body mass index (BMI) 31.0-31.9, adult: Secondary | ICD-10-CM | POA: Diagnosis not present

## 2023-06-29 DIAGNOSIS — I10 Essential (primary) hypertension: Secondary | ICD-10-CM | POA: Diagnosis not present

## 2023-06-29 DIAGNOSIS — E559 Vitamin D deficiency, unspecified: Secondary | ICD-10-CM

## 2023-06-29 DIAGNOSIS — Z0000 Encounter for general adult medical examination without abnormal findings: Secondary | ICD-10-CM

## 2023-06-29 DIAGNOSIS — Z1211 Encounter for screening for malignant neoplasm of colon: Secondary | ICD-10-CM | POA: Diagnosis not present

## 2023-06-29 DIAGNOSIS — E6609 Other obesity due to excess calories: Secondary | ICD-10-CM

## 2023-06-29 MED ORDER — POTASSIUM CHLORIDE ER 10 MEQ PO TBCR: 10.0000 meq | EXTENDED_RELEASE_TABLET | Freq: Every day | ORAL | 0 refills | Status: DC

## 2023-06-29 MED ORDER — LOSARTAN POTASSIUM-HCTZ 100-25 MG PO TABS: 1.0000 | ORAL_TABLET | Freq: Every day | ORAL | 3 refills | Status: DC

## 2023-06-29 NOTE — Assessment & Plan Note (Signed)
Discussed how this problem influences overall health and the risks it imposes  Reviewed plan for weight loss with lower calorie diet (via better food choices (lower glycemic and portion control) along with exercise building up to or more than 30 minutes 5 days per week including some aerobic activity and strength training

## 2023-06-29 NOTE — Assessment & Plan Note (Signed)
bp in fair control at this time  BP Readings from Last 1 Encounters:  06/29/23 118/80   No changes needed Most recent labs reviewed  Disc lifstyle change with low sodium diet and exercise  Plan to continue losartan hct 100-25 mg daily  Px klor con for low K

## 2023-06-29 NOTE — Progress Notes (Signed)
Subjective:    Patient ID: Gabriella Santiago, female    DOB: 1951-05-19, 72 y.o.   MRN: [id number]  HPI  Here for health maintenance exam and to review chronic medical problems   Wt Readings from Last 3 Encounters:  06/29/23 150 lb (68 kg)  07/29/22 163 lb (73.9 kg)  06/23/22 166 lb 8 oz (75.5 kg)   31.35 kg/m  Vitals:   06/29/23 0915  BP: 118/80  Pulse: 98  Temp: 98 F (36.7 C)  SpO2: 98%    Immunization History  Administered Date(s) Administered   Influenza Inj Mdck Quad Pf 01/16/2018   Influenza Whole 12/01/1998   Influenza, High Dose Seasonal PF 08/26/2019   Influenza,inj,Quad PF,6+ Mos 10/01/2018   Influenza-Unspecified 10/16/2015   PFIZER(Purple Top)SARS-COV-2 Vaccination 01/11/2020, 02/01/2020, 12/06/2020   Pneumococcal Conjugate-13 03/23/2018   Pneumococcal Polysaccharide-23 06/07/2019   Td 07/25/1998, 03/01/2008    Health Maintenance Due  Topic Date Due   Hepatitis C Screening  Never done   Zoster Vaccines- Shingrix (1 of 2) Never done   DTaP/Tdap/Td (3 - Tdap) 03/01/2018   COVID-19 Vaccine (4 - 2023-24 season) 08/01/2022   Medicare Annual Wellness (AWV)  06-12-23   Her father died since last visit / she took care of him  He lived to 44   Some grief counseling through hospice   Taking care of herself   Weight is down : taking care of her dad  Trying not to put it back on  Cutting portions  Moderation with everything    Back/sciatic pain is 90 % better - stretches and massage   Tetanus shot: -plans to check with pharmacy   Shingrix -interested   Mammogram 07/2023  has next one scheduled on Thursday  Self breast exam: no lumps   Colon cancer screening -colonoscopy 05/2017 with 10 y recall     Dexa 06/2019 - OSTEOPOROSIS  Took alendronate in the past  Falls-none Fractures-none Supplements -vitamin D3  Last vitamin D Lab Results  Component Value Date   VD25OH 78.99 06/22/2023    Exercise - stretching, walking  Has some weights -  for upper body  Goes to the gym      Mood    06/29/2023    9:57 AM 06/23/2022    9:20 AM 2022/06/11   10:58 AM 06/07/2021   11:32 AM 06/01/2020    3:35 PM  Depression screen PHQ 2/9  Decreased Interest 0 0 0 0 0  Down, Depressed, Hopeless 0 0 0 0 0  PHQ - 2 Score 0 0 0 0 0  Altered sleeping 0  0 0 0  Tired, decreased energy 0  0 0 0  Change in appetite 0  0 0 0  Feeling bad or failure about yourself  0  0 0 0  Trouble concentrating 0  0 0 0  Moving slowly or fidgety/restless 0  0 0 0  Suicidal thoughts 0  0 0 0  PHQ-9 Score 0  0 0 0  Difficult doing work/chores Not difficult at all  Not difficult at all Not difficult at all Not difficult at all   HTN bp is stable today  No cp or palpitations or headaches or edema  No side effects to medicines  BP Readings from Last 3 Encounters:  06/29/23 118/80  01/07/23 (!) 158/97  07/29/22 122/70    Losartan hct 100-25 mg daily   Pulse Readings from Last 3 Encounters:  06/29/23 98  01/07/23 94  07/29/22 92  Lab Results  Component Value Date   NA 141 06/22/2023   K 4.2 06/22/2023   CO2 30 06/22/2023   GLUCOSE 85 06/22/2023   BUN 15 06/22/2023   CREATININE 0.83 06/22/2023   CALCIUM 9.6 06/22/2023   GFR 71.43 06/22/2023   GFRNONAA 78.77 05/23/2009    Mild hyperlipidemia  Lab Results  Component Value Date   CHOL 183 06/22/2023   CHOL 190 06/16/2022   CHOL 187 06/04/2021   Lab Results  Component Value Date   HDL 58.00 06/22/2023   HDL 57.70 06/16/2022   HDL 54.30 06/04/2021   Lab Results  Component Value Date   LDLCALC 106 (H) 06/22/2023   LDLCALC 104 (H) 06/16/2022   LDLCALC 106 (H) 06/04/2021   Lab Results  Component Value Date   TRIG 96.0 06/22/2023   TRIG 143.0 06/16/2022   TRIG 138.0 06/04/2021   Lab Results  Component Value Date   CHOLHDL 3 06/22/2023   CHOLHDL 3 06/16/2022   CHOLHDL 3 06/04/2021   Lab Results  Component Value Date   LDLDIRECT 116.8 01/24/2013   Stable   DM screening Lab  Results  Component Value Date   HGBA1C 5.0 06/22/2023     Other labs  Lab Results  Component Value Date   ALT 14 06/22/2023   AST 15 06/22/2023   ALKPHOS 81 06/22/2023   BILITOT 0.6 06/22/2023   Lab Results  Component Value Date   WBC 5.9 06/22/2023   HGB 13.7 06/22/2023   HCT 40.3 06/22/2023   MCV 90.7 06/22/2023   PLT 410.0 (H) 06/22/2023   Lab Results  Component Value Date   TSH 1.66 06/22/2023     Patient Active Problem List   Diagnosis Date Noted   Diabetes mellitus screening 06/21/2023   Hypokalemia 06/23/2022   Dermatochalasis of both upper eyelids 04/09/2021   Ptosis of both eyelids 04/09/2021   Visual field defects 04/09/2021   Low back pain 06/07/2019   Welcome to Medicare preventive visit 03/23/2018   Vitamin D deficiency 03/23/2018   Screening mammogram, encounter for 03/17/2017   Estrogen deficiency 03/17/2017   Obesity 05/11/2015   Encounter for routine gynecological examination 01/31/2013   Colon cancer screening 01/31/2013   Routine general medical examination at a health care facility 01/23/2013   Hyperlipidemia, mild 03/01/2008   Essential hypertension 03/01/2008   GERD 03/01/2008   FIBROCYSTIC BREAST DISEASE 03/01/2008   POSTMENOPAUSAL STATUS 03/01/2008   Osteoporosis 03/01/2008   Past Medical History:  Diagnosis Date   Actinic keratosis    Allergy    Anemia    years ago    Hypertension    Superficial basal cell carcinoma 09/19/2021   right forearm, EDC 11/12/2021   Past Surgical History:  Procedure Laterality Date   TUBAL LIGATION     Social History   Tobacco Use   Smoking status: Never   Smokeless tobacco: Never  Vaping Use   Vaping status: Never Used  Substance Use Topics   Alcohol use: No    Alcohol/week: 0.0 standard drinks of alcohol   Drug use: No   Family History  Problem Relation Age of Onset   Breast cancer Cousin    Colon cancer Neg Hx    Colon polyps Neg Hx    Esophageal cancer Neg Hx    Rectal cancer  Neg Hx    Stomach cancer Neg Hx    Allergies  Allergen Reactions   Shellfish Allergy Rash   Current Outpatient Medications on File Prior to  Visit  Medication Sig Dispense Refill   Ascorbic Acid (VITAMIN C PO) Take by mouth.     b complex vitamins capsule Take 1 capsule by mouth daily.     Cholecalciferol (VITAMIN D3) 125 MCG (5000 UT) CAPS Take 1 capsule by mouth daily.      halobetasol (ULTRAVATE) 0.05 % ointment Apply twice daily to affected area as needed for up to 2 weeks. Avoid applying to face, groin, and axilla. Use as directed. Long-term use can cause thinning of the skin. 15 g 1   hydrochlorothiazide (HYDRODIURIL) 25 MG tablet      ketoconazole (NIZORAL) 2 % cream APPLY TWICE DAILY AS NEEDED FOR RASH ON ABDOMEN 60 g 2   meloxicam (MOBIC) 15 MG tablet TAKE 1 TABLET (15 MG TOTAL) BY MOUTH DAILY AS NEEDED FOR PAIN (BACK PAIN). WITH FOOD 30 tablet 5   Multiple Vitamins-Minerals (ZINC PO) Take by mouth.     Zinc Sulfate 110 MG TABS Take by mouth.     No current facility-administered medications on file prior to visit.    Review of Systems  Constitutional:  Positive for fatigue. Negative for activity change, appetite change, fever and unexpected weight change.  HENT:  Negative for congestion, ear pain, rhinorrhea, sinus pressure and sore throat.   Eyes:  Negative for pain, redness and visual disturbance.  Respiratory:  Negative for cough, shortness of breath and wheezing.   Cardiovascular:  Negative for chest pain and palpitations.  Gastrointestinal:  Negative for abdominal pain, blood in stool, constipation and diarrhea.  Endocrine: Negative for polydipsia and polyuria.  Genitourinary:  Negative for dysuria, frequency and urgency.  Musculoskeletal:  Positive for arthralgias. Negative for back pain and myalgias.  Skin:  Negative for pallor and rash.  Allergic/Immunologic: Negative for environmental allergies.  Neurological:  Negative for dizziness, syncope and headaches.   Hematological:  Negative for adenopathy. Does not bruise/bleed easily.  Psychiatric/Behavioral:  Negative for decreased concentration and dysphoric mood. The patient is not nervous/anxious.        Grief        Objective:   Physical Exam Constitutional:      General: She is not in acute distress.    Appearance: Normal appearance. She is well-developed. She is obese. She is not ill-appearing or diaphoretic.  HENT:     Head: Normocephalic and atraumatic.     Right Ear: Tympanic membrane, ear canal and external ear normal.     Left Ear: Tympanic membrane, ear canal and external ear normal.     Nose: Nose normal. No congestion.     Mouth/Throat:     Mouth: Mucous membranes are moist.     Pharynx: Oropharynx is clear. No posterior oropharyngeal erythema.  Eyes:     General: No scleral icterus.    Extraocular Movements: Extraocular movements intact.     Conjunctiva/sclera: Conjunctivae normal.     Pupils: Pupils are equal, round, and reactive to light.  Neck:     Thyroid: No thyromegaly.     Vascular: No carotid bruit or JVD.  Cardiovascular:     Rate and Rhythm: Normal rate and regular rhythm.     Pulses: Normal pulses.     Heart sounds: Normal heart sounds.     No gallop.  Pulmonary:     Effort: Pulmonary effort is normal. No respiratory distress.     Breath sounds: Normal breath sounds. No wheezing.     Comments: Good air exch Chest:     Chest wall: No tenderness.  Abdominal:     General: Bowel sounds are normal. There is no distension or abdominal bruit.     Palpations: Abdomen is soft. There is no mass.     Tenderness: There is no abdominal tenderness.     Hernia: No hernia is present.  Genitourinary:    Comments: Breast exam: No mass, nodules, thickening, tenderness, bulging, retraction, inflamation, nipple discharge or skin changes noted.  No axillary or clavicular LA.     Musculoskeletal:        General: No tenderness. Normal range of motion.     Cervical back:  Normal range of motion and neck supple. No rigidity. No muscular tenderness.     Right lower leg: No edema.     Left lower leg: No edema.     Comments: No kyphosis   Lymphadenopathy:     Cervical: No cervical adenopathy.  Skin:    General: Skin is warm and dry.     Coloration: Skin is not pale.     Findings: No erythema or rash.     Comments: Solar lentigines diffusely Fair complexion   Neurological:     Mental Status: She is alert. Mental status is at baseline.     Cranial Nerves: No cranial nerve deficit.     Motor: No abnormal muscle tone.     Coordination: Coordination normal.     Gait: Gait normal.     Deep Tendon Reflexes: Reflexes are normal and symmetric. Reflexes normal.  Psychiatric:        Mood and Affect: Mood normal.        Cognition and Memory: Cognition and memory normal.     Comments: Occational tearful when discussing grief   Candidly discusses symptoms and stressors             Assessment & Plan:   Problem List Items Addressed This Visit       Cardiovascular and Mediastinum   Essential hypertension    bp in fair control at this time  BP Readings from Last 1 Encounters:  06/29/23 118/80   No changes needed Most recent labs reviewed  Disc lifstyle change with low sodium diet and exercise  Plan to continue losartan hct 100-25 mg daily  Px klor con for low K      Relevant Medications   losartan-hydrochlorothiazide (HYZAAR) 100-25 MG tablet     Musculoskeletal and Integument   Osteoporosis    Dexa 06/2019 Ordered next one  No falls/fracture Taking vit D/ level is normal  Encouraged more strength building exercise         Other   Vitamin D deficiency    Vitamin D level is therapeutic with current supplementation Disc importance of this to bone and overall health Last vitamin D Lab Results  Component Value Date   VD25OH 78.99 06/22/2023         Routine general medical examination at a health care facility - Primary    Reviewed  health habits including diet and exercise and skin cancer prevention Reviewed appropriate screening tests for age  Also reviewed health mt list, fam hx and immunization status , as well as social and family history   See HPI Labs reviewed and ordered Urged to check with pharmacy re: tetanus update and shingrix vaccine  Mammogram planned this week  Colonoscopy utd 05/2017 Dexa ordered/ no falls or fracture  Encouraged strength training exercise  PHQ 0  Doing fairly well with grief after loss of father  Obesity    Discussed how this problem influences overall health and the risks it imposes  Reviewed plan for weight loss with lower calorie diet (via better food choices (lower glycemic and portion control) along with exercise building up to or more than 30 minutes 5 days per week including some aerobic activity and strength training         Hypokalemia    Lab Results  Component Value Date   K 4.2 06/22/2023   Continues current klor con dose       Hyperlipidemia, mild    Disc goals for lipids and reasons to control them Rev last labs with pt Rev low sat fat diet in detail LDL stable at 106 Radio is stable  Will continue to watch diet       Relevant Medications   losartan-hydrochlorothiazide (HYZAAR) 100-25 MG tablet   Estrogen deficiency    Dexa ordered       Relevant Orders   DG Bone Density   Diabetes mellitus screening    Lab Results  Component Value Date   HGBA1C 5.0 06/22/2023   disc imp of low glycemic diet and wt loss to prevent DM2       Colon cancer screening    Colonoscopy utd 2018

## 2023-06-29 NOTE — Patient Instructions (Addendum)
Try to get most of your carbohydrates from produce (with the exception of white potatoes) and whole grains Eat less bread/pasta/rice/snack foods/cereals/sweets and other items from the middle of the grocery store (processed carbs)  Keep walking  Add some strength training to your routine, this is important for bone and brain health and can reduce your risk of falls and help your body use insulin properly and regulate weight  Light weights, exercise bands , and internet videos are a good way to start  Yoga (chair or regular), machines , floor exercises or a gym with machines are also good options   Check with pharmacist about tetanus shot   If you are interested in the new shingles vaccine (Shingrix) - call your local pharmacy to check on coverage and availability  If affordable, get on a wait list at your pharmacy to get the vaccine.    Please schedule your bone density test  You have an order for:  []   2D Mammogram  []   3D Mammogram  [x]   Bone Density     Please call for appointment:   []   [facility]  [address] [place] [address]  [phone]  []   [facility] [place])   [address]  [place], [place] [address]  [phone]  []   [facility]      [address] [place], [place]        [phone]         []   [facility]  [address] [place], [place]  [phone]  [x]  [facility]   [address]   [place], [place] [address]  [phone]  []  [facility]  [address] [place], [place] [address] [phone]    Make sure to wear two piece clothing  No lotions powders or deodorants the day of the appointment Make sure to bring picture ID and insurance card.  Bring list of medications you are currently taking  including any supplements.   Schedule your screening mammogram through MyChart!   Select [place] imaging sites can now be scheduled through MyChart.  Log into your MyChart account.  Go to 'Visit' (or 'Appointments' if  on mobile App) --> Schedule an  Appointment  Under 'Select a Reason for Visit' choose the Mammogram  Screening option.  Complete the pre-visit questions  and select the time and place that  best fits your schedule

## 2023-06-29 NOTE — Assessment & Plan Note (Signed)
Colonoscopy utd 2018

## 2023-06-29 NOTE — Assessment & Plan Note (Signed)
Vitamin D level is therapeutic with current supplementation Disc importance of this to bone and overall health Last vitamin D Lab Results  Component Value Date   VD25OH 78.99 06/22/2023

## 2023-06-29 NOTE — Assessment & Plan Note (Signed)
Dexa ordered

## 2023-06-29 NOTE — Assessment & Plan Note (Signed)
Lab Results  Component Value Date   K 4.2 06/22/2023   Continues current klor con dose

## 2023-06-29 NOTE — Assessment & Plan Note (Signed)
Disc goals for lipids and reasons to control them Rev last labs with pt Rev low sat fat diet in detail LDL stable at 106 Radio is stable  Will continue to watch diet

## 2023-06-29 NOTE — Assessment & Plan Note (Signed)
Lab Results  Component Value Date   HGBA1C 5.0 06/22/2023   disc imp of low glycemic diet and wt loss to prevent DM2

## 2023-06-29 NOTE — Assessment & Plan Note (Signed)
Reviewed health habits including diet and exercise and skin cancer prevention Reviewed appropriate screening tests for age  Also reviewed health mt list, fam hx and immunization status , as well as social and family history   See HPI Labs reviewed and ordered Urged to check with pharmacy re: tetanus update and shingrix vaccine  Mammogram planned this week  Colonoscopy utd 05/2017 Dexa ordered/ no falls or fracture  Encouraged strength training exercise  PHQ 0  Doing fairly well with grief after loss of father

## 2023-06-29 NOTE — Assessment & Plan Note (Signed)
Dexa 06/2019 Ordered next one  No falls/fracture Taking vit D/ level is normal  Encouraged more strength building exercise

## 2023-07-01 ENCOUNTER — Ambulatory Visit (INDEPENDENT_AMBULATORY_CARE_PROVIDER_SITE_OTHER): Payer: Medicare HMO

## 2023-07-01 VITALS — Ht <= 58 in | Wt 150.0 lb

## 2023-07-01 DIAGNOSIS — Z0000 Encounter for general adult medical examination without abnormal findings: Secondary | ICD-10-CM

## 2023-07-01 NOTE — Patient Instructions (Signed)
Gabriella Santiago , Thank you for taking time to come for your Medicare Wellness Visit. I appreciate your ongoing commitment to your health goals. Please review the following plan we discussed and let me know if I can assist you in the future.   Referrals/Orders/Follow-Ups/Clinician Recommendations: none  This is a list of the screening recommended for you and due dates:  Health Maintenance  Topic Date Due   Hepatitis C Screening  Never done   Zoster (Shingles) Vaccine (1 of 2) Never done   DTaP/Tdap/Td vaccine (3 - Tdap) 03/01/2018   COVID-19 Vaccine (4 - 2023-24 season) 08/01/2022   Medicare Annual Wellness Visit  06/11/2023   Flu Shot  07/02/2023   Mammogram  05/15/2024   Colon Cancer Screening  05/19/2027   Pneumonia Vaccine  Completed   DEXA scan (bone density measurement)  Completed   HPV Vaccine  Aged Out    Advanced directives: (Copy Requested) Please bring a copy of your health care power of attorney and living will to the office to be added to your chart at your convenience.  Next Medicare Annual Wellness Visit scheduled for next year: Yes  Preventive Care 51 Years and Older, Female Preventive care refers to lifestyle choices and visits with your health care provider that can promote health and wellness. What does preventive care include? A yearly physical exam. This is also called an annual well check. Dental exams once or twice a year. Routine eye exams. Ask your health care provider how often you should have your eyes checked. Personal lifestyle choices, including: Daily care of your teeth and gums. Regular physical activity. Eating a healthy diet. Avoiding tobacco and drug use. Limiting alcohol use. Practicing safe sex. Taking low-dose aspirin every day. Taking vitamin and mineral supplements as recommended by your health care provider. What happens during an annual well check? The services and screenings done by your health care provider during your annual well check  will depend on your age, overall health, lifestyle risk factors, and family history of disease. Counseling  Your health care provider may ask you questions about your: Alcohol use. Tobacco use. Drug use. Emotional well-being. Home and relationship well-being. Sexual activity. Eating habits. History of falls. Memory and ability to understand (cognition). Work and work Astronomer. Reproductive health. Screening  You may have the following tests or measurements: Height, weight, and BMI. Blood pressure. Lipid and cholesterol levels. These may be checked every 5 years, or more frequently if you are over 70 years old. Skin check. Lung cancer screening. You may have this screening every year starting at age 70 if you have a 30-pack-year history of smoking and currently smoke or have quit within the past 15 years. Fecal occult blood test (FOBT) of the stool. You may have this test every year starting at age 70. Flexible sigmoidoscopy or colonoscopy. You may have a sigmoidoscopy every 5 years or a colonoscopy every 10 years starting at age 70. Hepatitis C blood test. Hepatitis B blood test. Sexually transmitted disease (STD) testing. Diabetes screening. This is done by checking your blood sugar (glucose) after you have not eaten for a while (fasting). You may have this done every 1-3 years. Bone density scan. This is done to screen for osteoporosis. You may have this done starting at age 70. Mammogram. This may be done every 1-2 years. Talk to your health care provider about how often you should have regular mammograms. Talk with your health care provider about your test results, treatment options, and if necessary,  the need for more tests. Vaccines  Your health care provider may recommend certain vaccines, such as: Influenza vaccine. This is recommended every year. Tetanus, diphtheria, and acellular pertussis (Tdap, Td) vaccine. You may need a Td booster every 10 years. Zoster vaccine. You  may need this after age 70. Pneumococcal 13-valent conjugate (PCV13) vaccine. One dose is recommended after age 70. Pneumococcal polysaccharide (PPSV23) vaccine. One dose is recommended after age 70. Talk to your health care provider about which screenings and vaccines you need and how often you need them. This information is not intended to replace advice given to you by your health care provider. Make sure you discuss any questions you have with your health care provider. Document Released: 12/14/2015 Document Revised: 08/06/2016 Document Reviewed: 09/18/2015 Elsevier Interactive Patient Education  2017 [employer].  Fall Prevention in the Home Falls can cause injuries. They can happen to people of all ages. There are many things you can do to make your home safe and to help prevent falls. What can I do on the outside of my home? Regularly fix the edges of walkways and driveways and fix any cracks. Remove anything that might make you trip as you walk through a door, such as a raised step or threshold. Trim any bushes or trees on the path to your home. Use bright outdoor lighting. Clear any walking paths of anything that might make someone trip, such as rocks or tools. Regularly check to see if handrails are loose or broken. Make sure that both sides of any steps have handrails. Any raised decks and porches should have guardrails on the edges. Have any leaves, snow, or ice cleared regularly. Use sand or salt on walking paths during winter. Clean up any spills in your garage right away. This includes oil or grease spills. What can I do in the bathroom? Use night lights. Install grab bars by the toilet and in the tub and shower. Do not use towel bars as grab bars. Use non-skid mats or decals in the tub or shower. If you need to sit down in the shower, use a plastic, non-slip stool. Keep the floor dry. Clean up any water that spills on the floor as soon as it happens. Remove soap buildup  in the tub or shower regularly. Attach bath mats securely with double-sided non-slip rug tape. Do not have throw rugs and other things on the floor that can make you trip. What can I do in the bedroom? Use night lights. Make sure that you have a light by your bed that is easy to reach. Do not use any sheets or blankets that are too big for your bed. They should not hang down onto the floor. Have a firm chair that has side arms. You can use this for support while you get dressed. Do not have throw rugs and other things on the floor that can make you trip. What can I do in the kitchen? Clean up any spills right away. Avoid walking on wet floors. Keep items that you use a lot in easy-to-reach places. If you need to reach something above you, use a strong step stool that has a grab bar. Keep electrical cords out of the way. Do not use floor polish or wax that makes floors slippery. If you must use wax, use non-skid floor wax. Do not have throw rugs and other things on the floor that can make you trip. What can I do with my stairs? Do not leave any items on  the stairs. Make sure that there are handrails on both sides of the stairs and use them. Fix handrails that are broken or loose. Make sure that handrails are as long as the stairways. Check any carpeting to make sure that it is firmly attached to the stairs. Fix any carpet that is loose or worn. Avoid having throw rugs at the top or bottom of the stairs. If you do have throw rugs, attach them to the floor with carpet tape. Make sure that you have a light switch at the top of the stairs and the bottom of the stairs. If you do not have them, ask someone to add them for you. What else can I do to help prevent falls? Wear shoes that: Do not have high heels. Have rubber bottoms. Are comfortable and fit you well. Are closed at the toe. Do not wear sandals. If you use a stepladder: Make sure that it is fully opened. Do not climb a closed  stepladder. Make sure that both sides of the stepladder are locked into place. Ask someone to hold it for you, if possible. Clearly mark and make sure that you can see: Any grab bars or handrails. First and last steps. Where the edge of each step is. Use tools that help you move around (mobility aids) if they are needed. These include: Canes. Walkers. Scooters. Crutches. Turn on the lights when you go into a dark area. Replace any light bulbs as soon as they burn out. Set up your furniture so you have a clear path. Avoid moving your furniture around. If any of your floors are uneven, fix them. If there are any pets around you, be aware of where they are. Review your medicines with your doctor. Some medicines can make you feel dizzy. This can increase your chance of falling. Ask your doctor what other things that you can do to help prevent falls. This information is not intended to replace advice given to you by your health care provider. Make sure you discuss any questions you have with your health care provider. Document Released: 09/13/2009 Document Revised: 04/24/2016 Document Reviewed: 12/22/2014 Elsevier Interactive Patient Education  2017 [employer].

## 2023-07-01 NOTE — Progress Notes (Signed)
Subjective:   Gabriella Santiago is a 72 y.o. female who presents for Medicare Annual (Subsequent) preventive examination.  Visit Complete: Virtual  I connected with  Larayah B Hinners on 07/01/23 by a audio enabled telemedicine application and verified that I am speaking with the correct person using two identifiers.  Patient Location: Home  Provider Location: Home Office  I discussed the limitations of evaluation and management by telemedicine. The patient expressed understanding and agreed to proceed.  Vital Signs: Unable to obtain new vitals due to this being a telehealth visit.    Review of Systems      Cardiac Risk Factors include: advanced age (>63men, >44 women);dyslipidemia;obesity (BMI >30kg/m2);hypertension     Objective:    Today's Vitals   07/01/23 1436  Weight: 150 lb (68 kg)  Height: 4\' 8"  (1.422 m)   Body mass index is 33.63 kg/m.     07/01/2023    2:44 PM 06/10/2022   11:00 AM 06/07/2021   11:23 AM 06/01/2020    3:34 PM 04/01/2019    9:08 AM 05/04/2017    3:27 PM  Advanced Directives  Does Patient Have a Medical Advance Directive? Yes No Yes Yes Yes Yes  Type of Estate agent of [place];Living will  Healthcare Power of [place];Living will Healthcare Power of [place];Living will Healthcare Power of [place];Living will Healthcare Power of [place];Living will  Copy of Healthcare Power of Attorney in Chart? No - copy requested  No - copy requested No - copy requested No - copy requested   Would patient like information on creating a medical advance directive?  No - Patient declined        Current Medications (verified) Outpatient Encounter Medications as of 07/01/2023  Medication Sig   Ascorbic Acid (VITAMIN C PO) Take by mouth.   b complex vitamins capsule Take 1 capsule by mouth daily.   Cholecalciferol (VITAMIN D3) 125 MCG (5000 UT) CAPS Take 1 capsule by mouth daily.    ketoconazole (NIZORAL) 2 % cream APPLY TWICE DAILY AS NEEDED FOR  RASH ON ABDOMEN   losartan-hydrochlorothiazide (HYZAAR) 100-25 MG tablet Take 1 tablet by mouth daily.   meloxicam (MOBIC) 15 MG tablet TAKE 1 TABLET (15 MG TOTAL) BY MOUTH DAILY AS NEEDED FOR PAIN (BACK PAIN). WITH FOOD   potassium chloride (KLOR-CON) 10 MEQ tablet Take 1 tablet (10 mEq total) by mouth daily.   Zinc Sulfate 110 MG TABS Take by mouth.   halobetasol (ULTRAVATE) 0.05 % ointment Apply twice daily to affected area as needed for up to 2 weeks. Avoid applying to face, groin, and axilla. Use as directed. Long-term use can cause thinning of the skin. (Patient not taking: Reported on 07/01/2023)   hydrochlorothiazide (HYDRODIURIL) 25 MG tablet  (Patient not taking: Reported on 07/01/2023)   Multiple Vitamins-Minerals (ZINC PO) Take by mouth. (Patient not taking: Reported on 07/01/2023)   No facility-administered encounter medications on file as of 07/01/2023.    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   right forearm, [facility] 11/12/2021   Past Surgical History:  Procedure Laterality Date   TUBAL LIGATION     Family History  Problem Relation Age of Onset   Breast cancer Cousin    Colon cancer Neg Hx    Colon polyps Neg Hx  Esophageal cancer Neg Hx    Rectal cancer Neg Hx    Stomach cancer Neg Hx    Social History   Socioeconomic History   Marital status: Widowed    Spouse name: Not on file   Number of children: Not on file   Years of education: Not on file   Highest education level: Not on file  Occupational History   Not on file  Tobacco Use   Smoking status: Never   Smokeless tobacco: Never  Vaping Use   Vaping status: Never Used  Substance and Sexual Activity   Alcohol use: No    Alcohol/week: 0.0 standard drinks of alcohol   Drug use: No   Sexual activity: Not on file  Other Topics Concern    Not on file  Social History Narrative   Not on file   Social Determinants of Health   Financial Resource Strain: Low Risk  (07/01/2023)   Overall Financial Resource Strain (CARDIA)    Difficulty of Paying Living Expenses: Not hard at all  Food Insecurity: No Food Insecurity (07/01/2023)   Hunger Vital Sign    Worried About Running Out of Food in the Last Year: Never true    Ran Out of Food in the Last Year: Never true  Transportation Needs: No Transportation Needs (07/01/2023)   PRAPARE - Administrator, Civil Service (Medical): No    Lack of Transportation (Non-Medical): No  Physical Activity: Sufficiently Active (07/01/2023)   Exercise Vital Sign    Days of Exercise per Week: 6 days    Minutes of Exercise per Session: 30 min  Stress: No Stress Concern Present (07/01/2023)   [employer] of Occupational Health - Occupational Stress Questionnaire    Feeling of Stress : Not at all  Social Connections: Moderately Isolated (07/01/2023)   Social Connection and Isolation Panel [NHANES]    Frequency of Communication with Friends and Family: More than three times a week    Frequency of Social Gatherings with Friends and Family: More than three times a week    Attends Religious Services: More than 4 times per year    Active Member of [employer] or Organizations: No    Attends Banker Meetings: Never    Marital Status: Widowed    Tobacco Counseling Counseling given: Not Answered   Clinical Intake:  Pre-visit preparation completed: Yes  Pain : No/denies pain     BMI - recorded: 33.63 Nutritional Status: BMI > 30  Obese Nutritional Risks: None Diabetes: No  How often do you need to have someone help you when you read instructions, pamphlets, or other written materials from your doctor or pharmacy?: 1 - Never  Interpreter Needed?: No  Information entered by :: C.Aalivia Mcgraw LPN   Activities of Daily Living    07/01/2023    2:46 PM  In your present  state of health, do you have any difficulty performing the following activities:  Hearing? 0  Vision? 0  Difficulty concentrating or making decisions? 0  Walking or climbing stairs? 0  Dressing or bathing? 0  Doing errands, shopping? 0  Preparing Food and eating ? N  Using the Toilet? N  In the past six months, have you accidently leaked urine? N  Do you have problems with loss of bowel control? N  Managing your Medications? N  Managing your Finances? N  Housekeeping or managing your Housekeeping? N    Patient Care Team: Tower, Audrie Gallus, MD as PCP - General  Indicate  any recent Medical Services you may have received from other than [facility] providers in the past year (date may be approximate).     Assessment:   This is a routine wellness examination for Gabriella Santiago.  Hearing/Vision screen Hearing Screening - Comments:: Denies hearing difficulties   Vision Screening - Comments:: Glasses - [facility] - UTD on eye exam.  Dietary issues and exercise activities discussed:     Goals Addressed             This Visit's Progress    Patient Stated       To increase exercise. (Weight training, walking, stretching)       Depression Screen    07/01/2023    2:44 PM 06/29/2023    9:57 AM 06/23/2022    9:20 AM 06/10/2022   10:58 AM 06/07/2021   11:32 AM 06/01/2020    3:35 PM 04/01/2019    9:08 AM  PHQ 2/9 Scores  PHQ - 2 Score 0 0 0 0 0 0 0  PHQ- 9 Score 0 0  0 0 0 0    Fall Risk    07/01/2023    2:45 PM 06/29/2023    9:57 AM 06/23/2022    9:19 AM 06/10/2022   11:00 AM 06/07/2021   11:27 AM  Fall Risk   Falls in the past year? 0 0 0 0 0  Number falls in past yr: 0 0  0 0  Injury with Fall? 0 0  0 0  Risk for fall due to : No Fall Risks No Fall Risks  No Fall Risks Medication side effect  Follow up Falls prevention discussed;Falls evaluation completed Falls evaluation completed  Falls evaluation completed Falls evaluation completed;Falls prevention discussed    MEDICARE RISK  AT HOME:  Medicare Risk at Home - 07/01/23 1447     Any stairs in or around the home? No    If so, are there any without handrails? No    Home free of loose throw rugs in walkways, pet beds, electrical cords, etc? Yes    Adequate lighting in your home to reduce risk of falls? Yes    Life alert? No    Use of a cane, walker or w/c? No    Grab bars in the bathroom? No    Shower chair or bench in shower? Yes    Elevated toilet seat or a handicapped toilet? No             TIMED UP AND GO:  Was the test performed?  No    Cognitive Function:    06/07/2021   11:35 AM 06/01/2020    3:38 PM 04/01/2019    9:23 AM  MMSE - Mini Mental State Exam  Orientation to time 5 5 5   Orientation to Place 5 5 5   Registration 3 3 3   Attention/ Calculation 5 5 0  Recall 3 3 3   Language- name 2 objects   0  Language- repeat 1 1 1   Language- follow 3 step command   0  Language- read & follow direction   0  Write a sentence   0  Copy design   0  Total score   17        07/01/2023    2:48 PM 06/10/2022   11:02 AM  6CIT Screen  What Year? 0 points 0 points  What month? 0 points 0 points  What time? 0 points 0 points  Count back from 20 0 points 0  points  Months in reverse 0 points 0 points  Repeat phrase 0 points 0 points  Total Score 0 points 0 points    Immunizations Immunization History  Administered Date(s) Administered   Influenza Inj Mdck Quad Pf 01/16/2018   Influenza Whole 12/01/1998   Influenza, High Dose Seasonal PF 08/26/2019   Influenza,inj,Quad PF,6+ Mos 10/01/2018   Influenza-Unspecified 10/16/2015   PFIZER(Purple Top)SARS-COV-2 Vaccination 01/11/2020, 02/01/2020, 12/06/2020   Pneumococcal Conjugate-13 03/23/2018   Pneumococcal Polysaccharide-23 06/07/2019   Td 07/25/1998, 03/01/2008    TDAP status: Due, Education has been provided regarding the importance of this vaccine. Advised may receive this vaccine at local pharmacy or Health Dept. Aware to provide a copy of the  vaccination record if obtained from local pharmacy or Health Dept. Verbalized acceptance and understanding.  Flu Vaccine status: Due, Education has been provided regarding the importance of this vaccine. Advised may receive this vaccine at local pharmacy or Health Dept. Aware to provide a copy of the vaccination record if obtained from local pharmacy or Health Dept. Verbalized acceptance and understanding.  Pneumococcal vaccine status: Up to date  Covid-19 vaccine status: Information provided on how to obtain vaccines.   Qualifies for Shingles Vaccine? Yes   Zostavax completed No   Shingrix Completed?: No.    Education has been provided regarding the importance of this vaccine. Patient has been advised to call insurance company to determine out of pocket expense if they have not yet received this vaccine. Advised may also receive vaccine at local pharmacy or Health Dept. Verbalized acceptance and understanding.  Screening Tests Health Maintenance  Topic Date Due   Hepatitis C Screening  Never done   Zoster Vaccines- Shingrix (1 of 2) Never done   DTaP/Tdap/Td (3 - Tdap) 03/01/2018   COVID-19 Vaccine (4 - 2023-24 season) 08/01/2022   Medicare Annual Wellness (AWV)  06/11/2023   INFLUENZA VACCINE  07/02/2023   MAMMOGRAM  05/15/2024   Colonoscopy  05/19/2027   Pneumonia Vaccine 79+ Years old  Completed   DEXA SCAN  Completed   HPV VACCINES  Aged Out    Health Maintenance  Health Maintenance Due  Topic Date Due   Hepatitis C Screening  Never done   Zoster Vaccines- Shingrix (1 of 2) Never done   DTaP/Tdap/Td (3 - Tdap) 03/01/2018   COVID-19 Vaccine (4 - 2023-24 season) 08/01/2022   Medicare Annual Wellness (AWV)  06/11/2023    Colorectal cancer screening: Type of screening: Colonoscopy. Completed 05/18/17. Repeat every 10 years  Mammogram status: Completed 05/18/22. Repeat every year Appointment scheduled for 07/02/23  Bone Density status: Ordered 06/29/23. Pt provided with  contact info and advised to call to schedule appt.  Lung Cancer Screening: (Low Dose CT Chest recommended if Age 37-80 years, 20 pack-year currently smoking OR have quit w/in 15years.) does not qualify.   Lung Cancer Screening Referral: no  Additional Screening:  Hepatitis C Screening: does qualify; Completed DUE  Vision Screening: Recommended annual ophthalmology exams for early detection of glaucoma and other disorders of the eye. Is the patient up to date with their annual eye exam?  Yes  Who is the provider or what is the name of the office in which the patient attends annual eye exams? [facility] If pt is not established with a provider, would they like to be referred to a provider to establish care? Yes .   Dental Screening: Recommended annual dental exams for proper oral hygiene    Community Resource Referral / Chronic Care Management:  CRR required this visit?  No   CCM required this visit?  No     Plan:     I have personally reviewed and noted the following in the patient's chart:   Medical and social history Use of alcohol, tobacco or illicit drugs  Current medications and supplements including opioid prescriptions. Patient is not currently taking opioid prescriptions. Functional ability and status Nutritional status Physical activity Advanced directives List of other physicians Hospitalizations, surgeries, and ER visits in previous 12 months Vitals Screenings to include cognitive, depression, and falls Referrals and appointments  In addition, I have reviewed and discussed with patient certain preventive protocols, quality metrics, and best practice recommendations. A written personalized care plan for preventive services as well as general preventive health recommendations were provided to patient.     Maryan Puls, LPN   [id number]   After Visit Summary: (MyChart) Due to this being a telephonic visit, the after visit summary with patients  personalized plan was offered to patient via MyChart   Nurse Notes: none

## 2023-07-02 ENCOUNTER — Ambulatory Visit
Admission: RE | Admit: 2023-07-02 | Discharge: 2023-07-02 | Disposition: A | Discharge status: Home / Self Care | Payer: Medicare HMO | Source: Ambulatory Visit | Attending: Family Medicine | Admitting: Family Medicine

## 2023-07-02 DIAGNOSIS — Z0000 Encounter for general adult medical examination without abnormal findings: Secondary | ICD-10-CM

## 2023-07-02 DIAGNOSIS — Z1231 Encounter for screening mammogram for malignant neoplasm of breast: Secondary | ICD-10-CM | POA: Diagnosis not present

## 2023-07-20 DIAGNOSIS — M9904 Segmental and somatic dysfunction of sacral region: Secondary | ICD-10-CM | POA: Diagnosis not present

## 2023-07-20 DIAGNOSIS — M9903 Segmental and somatic dysfunction of lumbar region: Secondary | ICD-10-CM | POA: Diagnosis not present

## 2023-07-20 DIAGNOSIS — M9905 Segmental and somatic dysfunction of pelvic region: Secondary | ICD-10-CM | POA: Diagnosis not present

## 2023-07-20 DIAGNOSIS — M5432 Sciatica, left side: Secondary | ICD-10-CM | POA: Diagnosis not present

## 2023-07-24 DIAGNOSIS — M9903 Segmental and somatic dysfunction of lumbar region: Secondary | ICD-10-CM | POA: Diagnosis not present

## 2023-07-24 DIAGNOSIS — M9904 Segmental and somatic dysfunction of sacral region: Secondary | ICD-10-CM | POA: Diagnosis not present

## 2023-07-24 DIAGNOSIS — M9905 Segmental and somatic dysfunction of pelvic region: Secondary | ICD-10-CM | POA: Diagnosis not present

## 2023-07-24 DIAGNOSIS — M5432 Sciatica, left side: Secondary | ICD-10-CM | POA: Diagnosis not present

## 2023-07-31 DIAGNOSIS — M9905 Segmental and somatic dysfunction of pelvic region: Secondary | ICD-10-CM | POA: Diagnosis not present

## 2023-07-31 DIAGNOSIS — M9903 Segmental and somatic dysfunction of lumbar region: Secondary | ICD-10-CM | POA: Diagnosis not present

## 2023-07-31 DIAGNOSIS — M5432 Sciatica, left side: Secondary | ICD-10-CM | POA: Diagnosis not present

## 2023-07-31 DIAGNOSIS — M9904 Segmental and somatic dysfunction of sacral region: Secondary | ICD-10-CM | POA: Diagnosis not present

## 2023-08-05 DIAGNOSIS — M9904 Segmental and somatic dysfunction of sacral region: Secondary | ICD-10-CM | POA: Diagnosis not present

## 2023-08-05 DIAGNOSIS — M5432 Sciatica, left side: Secondary | ICD-10-CM | POA: Diagnosis not present

## 2023-08-05 DIAGNOSIS — M9905 Segmental and somatic dysfunction of pelvic region: Secondary | ICD-10-CM | POA: Diagnosis not present

## 2023-08-05 DIAGNOSIS — M9903 Segmental and somatic dysfunction of lumbar region: Secondary | ICD-10-CM | POA: Diagnosis not present

## 2023-08-31 DIAGNOSIS — H524 Presbyopia: Secondary | ICD-10-CM | POA: Diagnosis not present

## 2023-08-31 DIAGNOSIS — H52223 Regular astigmatism, bilateral: Secondary | ICD-10-CM | POA: Diagnosis not present

## 2023-11-07 ENCOUNTER — Other Ambulatory Visit: Payer: Self-pay | Admitting: Family Medicine

## 2024-04-06 DIAGNOSIS — H2513 Age-related nuclear cataract, bilateral: Secondary | ICD-10-CM | POA: Diagnosis not present

## 2024-04-06 DIAGNOSIS — H43813 Vitreous degeneration, bilateral: Secondary | ICD-10-CM | POA: Diagnosis not present

## 2024-04-06 DIAGNOSIS — H47321 Drusen of optic disc, right eye: Secondary | ICD-10-CM | POA: Diagnosis not present

## 2024-04-06 DIAGNOSIS — H524 Presbyopia: Secondary | ICD-10-CM | POA: Diagnosis not present

## 2024-04-19 ENCOUNTER — Encounter: Payer: Self-pay | Admitting: Dermatology

## 2024-04-19 ENCOUNTER — Ambulatory Visit: Admitting: Dermatology

## 2024-04-19 DIAGNOSIS — D1801 Hemangioma of skin and subcutaneous tissue: Secondary | ICD-10-CM

## 2024-04-19 DIAGNOSIS — W908XXA Exposure to other nonionizing radiation, initial encounter: Secondary | ICD-10-CM

## 2024-04-19 DIAGNOSIS — L578 Other skin changes due to chronic exposure to nonionizing radiation: Secondary | ICD-10-CM | POA: Diagnosis not present

## 2024-04-19 DIAGNOSIS — D229 Melanocytic nevi, unspecified: Secondary | ICD-10-CM

## 2024-04-19 DIAGNOSIS — L814 Other melanin hyperpigmentation: Secondary | ICD-10-CM | POA: Diagnosis not present

## 2024-04-19 DIAGNOSIS — L720 Epidermal cyst: Secondary | ICD-10-CM

## 2024-04-19 DIAGNOSIS — Z1283 Encounter for screening for malignant neoplasm of skin: Secondary | ICD-10-CM | POA: Diagnosis not present

## 2024-04-19 DIAGNOSIS — D239 Other benign neoplasm of skin, unspecified: Secondary | ICD-10-CM

## 2024-04-19 DIAGNOSIS — D2371 Other benign neoplasm of skin of right lower limb, including hip: Secondary | ICD-10-CM | POA: Diagnosis not present

## 2024-04-19 DIAGNOSIS — Z85828 Personal history of other malignant neoplasm of skin: Secondary | ICD-10-CM | POA: Diagnosis not present

## 2024-04-19 DIAGNOSIS — L821 Other seborrheic keratosis: Secondary | ICD-10-CM

## 2024-04-19 NOTE — Progress Notes (Signed)
 Follow-Up Visit   Subjective  Gabriella Santiago is a 73 y.o. female who presents for the following: Skin Cancer Screening and Full Body Skin Exam  The patient presents for Total-Body Skin Exam (TBSE) for skin cancer screening and mole check. The patient has spots, moles and lesions to be evaluated.  Pt c/o cystic nodule under the arm that will not resolve.  The following portions of the chart were reviewed this encounter and updated as appropriate: medications, allergies, medical history  Review of Systems:  No other skin or systemic complaints except as noted in HPI or Assessment and Plan.  Objective  Well appearing patient in no apparent distress; mood and affect are within normal limits.  A full examination was performed including scalp, head, eyes, ears, nose, lips, neck, chest, axillae, abdomen, back, buttocks, bilateral upper extremities, bilateral lower extremities, hands, feet, fingers, toes, fingernails, and toenails. All findings within normal limits unless otherwise noted below.   Relevant physical exam findings are noted in the Assessment and Plan.    Assessment & Plan   SKIN CANCER SCREENING PERFORMED TODAY.  ACTINIC DAMAGE - Chronic condition, secondary to cumulative UV/sun exposure - diffuse scaly erythematous macules with underlying dyspigmentation - Recommend daily broad spectrum sunscreen SPF 30+ to sun-exposed areas, reapply every 2 hours as needed.  - Staying in the shade or wearing long sleeves, sun glasses (UVA+UVB protection) and wide brim hats (4-inch brim around the entire circumference of the hat) are also recommended for sun protection.  - Call for new or changing lesions.  LENTIGINES, SEBORRHEIC KERATOSES, HEMANGIOMAS - Benign normal skin lesions - Benign-appearing - Call for any changes  MELANOCYTIC NEVI - Tan-brown and/or pink-flesh-colored symmetric macules and papules - Benign appearing on exam today - Observation - Call clinic for new or  changing moles - Recommend daily use of broad spectrum spf 30+ sunscreen to sun-exposed areas.   HISTORY OF BASAL CELL CARCINOMA OF THE SKIN - R forearm, tx with [facility] 11/12/21 - No evidence of recurrence today - Recommend regular full body skin exams - Recommend daily broad spectrum sunscreen SPF 30+ to sun-exposed areas, reapply every 2 hours as needed.  - Call if any new or changing lesions are noted between office visits  EPIDERMAL INCLUSION CYST Exam: Subcutaneous nodule at left axilla  Benign-appearing. Exam most consistent with an epidermal inclusion cyst. Discussed that a cyst is a benign growth that can grow over time and sometimes get irritated or inflamed. Recommend observation if it is not bothersome. Discussed option of surgical excision to remove it if it is growing, symptomatic, or other changes noted. Please call for new or changing lesions so they can be evaluated. Patient wants to excise  DERMATOFIBROMA Exam: Firm pink/brown papulenodule with dimple sign at right medial knee. Treatment Plan: A dermatofibroma is a benign growth possibly related to trauma, such as an insect bite, cut from shaving, or inflamed acne-type bump.  Treatment options to remove include shave or excision with resulting scar and risk of recurrence.  Since benign-appearing and not bothersome, will observe for now.    MULTIPLE BENIGN NEVI   LENTIGINES   ACTINIC ELASTOSIS   SEBORRHEIC KERATOSES   CHERRY ANGIOMA   EIC (EPIDERMAL INCLUSION CYST)   DERMATOFIBROMA   Return in about 1 year (around 04/19/2025) for TBSE - hx BCC, cyst surgery with Dr. Felipe Horton.  Gabriella Santiago, CMA, am acting as scribe for Harris Liming, MD .   Documentation: I have reviewed the above documentation for  accuracy and completeness, and I agree with the above.  Harris Liming, MD

## 2024-04-19 NOTE — Patient Instructions (Signed)

## 2024-04-25 ENCOUNTER — Ambulatory Visit: Payer: Medicare HMO | Admitting: Dermatology

## 2024-05-18 ENCOUNTER — Ambulatory Visit: Admitting: Dermatology

## 2024-05-18 ENCOUNTER — Encounter: Payer: Self-pay | Admitting: Dermatology

## 2024-05-18 DIAGNOSIS — L720 Epidermal cyst: Secondary | ICD-10-CM

## 2024-05-18 DIAGNOSIS — D492 Neoplasm of unspecified behavior of bone, soft tissue, and skin: Secondary | ICD-10-CM | POA: Diagnosis not present

## 2024-05-18 MED ORDER — MUPIROCIN 2 % EX OINT: TOPICAL_OINTMENT | CUTANEOUS | 0 refills | Status: AC

## 2024-05-18 NOTE — Patient Instructions (Signed)
 Wound Care Instructions  On the day following your surgery, you should begin doing daily dressing changes: Remove the old dressing and discard it. Cleanse the wound gently with tap water. This may be done in the shower or by placing a wet gauze pad directly on the wound and letting it soak for several minutes. It is important to gently remove any dried blood from the wound in order to encourage healing. This may be done by gently rolling a moistened Q-tip on the dried blood. Do not pick at the wound. If the wound should start to bleed, continue cleaning the wound, then place a moist gauze pad on the wound and hold pressure for a few minutes.  Make sure you then dry the skin surrounding the wound completely or the tape will not stick to the skin. Do not use cotton balls on the wound. After the wound is clean and dry, apply the ointment gently with a Q-tip. Cut a non-stick pad to fit the size of the wound. Lay the pad flush to the wound. If the wound is draining, you may want to reinforce it with a small amount of gauze on top of the non-stick pad for a little added compression to the area. Use the tape to seal the area completely. Select from the following with respect to your individual situation: If your wound has been stitched closed: continue the above steps 1-8 at least daily until your sutures are removed. If your wound has been left open to heal: continue steps 1-8 at least daily for the first 3-4 weeks. We would like for you to take a few extra precautions for at least the next week. Sleep with your head elevated on pillows if our wound is on your head. Do not bend over or lift heavy items to reduce the chance of elevated blood pressure to the wound Do not participate in particularly strenuous activities.   Below is a list of dressing supplies you might need.  Cotton-tipped applicators - Q-tips Gauze pads (2x2 and/or 4x4) - All-Purpose Sponges Non-stick dressing material - Telfa Tape -  Paper or Hypafix New and clean tube of petroleum jelly - Vaseline    Comments on Post-Operative Period Slight swelling and redness often appear around the wound. This is normal and will disappear within several days following the surgery. The healing wound will drain a brownish-red-yellow discharge during healing. This is a normal phase of wound healing. As the wound begins to heal, the drainage may increase in amount. Again, this drainage is normal. Notify us if the drainage becomes persistently bloody, excessively swollen, or intensely painful or develops a foul odor or red streaks.  If you should experience mild discomfort during the healing phase, you may take an aspirin-free medication such as Tylenol (acetaminophen). Notify us if the discomfort is severe or persistent. Avoid alcoholic beverages when taking pain medicine.  In Case of Wound Hemorrhage A wound hemorrhage is when the bandage suddenly becomes soaked with bright red blood and flows profusely. If this happens, sit down or lie down with your head elevated. If the wound has a dressing on it, do not remove the dressing. Apply pressure to the existing gauze. If the wound is not covered, use a gauze pad to apply pressure and continue applying the pressure for 20 minutes without peeking. DO NOT COVER THE WOUND WITH A LARGE TOWEL OR WASH CLOTH. Release your hand from the wound site but do not remove the dressing. If the bleeding has stopped,  gently clean around the wound. Leave the dressing in place for 24 hours if possible. This wait time allows the blood vessels to close off so that you do not spark a new round of bleeding by disrupting the newly clotted blood vessels with an immediate dressing change. If the bleeding does not subside, continue to hold pressure. If matters are out of your control, contact an After Hours clinic or go to the Emergency Room.

## 2024-05-18 NOTE — Progress Notes (Signed)
   Follow-Up Visit   Subjective  Gabriella Santiago is a 73 y.o. female who presents for the following: Excision of a cyst- left axilla   The following portions of the chart were reviewed this encounter and updated as appropriate: medications, allergies, medical history  Review of Systems:  No other skin or systemic complaints except as noted in HPI or Assessment and Plan.  Objective  Well appearing patient in no apparent distress; mood and affect are within normal limits.  A focused examination was performed of the following areas: Left axilla  Relevant physical exam findings are noted in the Assessment and Plan.   Left Axilla Subcutaneous nodule.   Assessment & Plan   NEOPLASM OF SKIN Left Axilla Skin excision  Excision method:  elliptical Total excision diameter (cm):  0.6 Informed consent: discussed and consent obtained   Timeout: patient name, date of birth, surgical site, and procedure verified   Procedure prep:  Patient was prepped and draped in usual sterile fashion Prep type:  Chlorhexidine Anesthesia: the lesion was anesthetized in a standard fashion   Anesthetic:  1% lidocaine w/ epinephrine 1-100,000 buffered w/ 8.4% NaHCO3 (6 cc) Instrument used: #15 blade   Hemostasis achieved with: suture, pressure and electrodesiccation   Outcome: patient tolerated procedure well with no complications    Skin repair Complexity:  Intermediate Final length (cm):  1.4 Informed consent: discussed and consent obtained   Timeout: patient name, date of birth, surgical site, and procedure verified   Procedure prep:  Patient was prepped and draped in usual sterile fashion Prep type:  Chlorhexidine Anesthesia: the lesion was anesthetized in a standard fashion   Anesthetic:  1% lidocaine w/ epinephrine 1-100,000 buffered w/ 8.4% NaHCO3 Reason for type of repair: reduce tension to allow closure, reduce the risk of dehiscence, infection, and necrosis, reduce subcutaneous dead space and  avoid a hematoma, allow closure of the large defect and preserve normal anatomy   Undermining: edges could be approximated without difficulty   Subcutaneous layers (deep stitches):  Suture size:  4-0 Suture type: Monocryl (poliglecaprone 25)   Stitches:  Buried vertical mattress Fine/surface layer approximation (top stitches):  Suture size:  5-0 Suture type: Prolene (polypropylene)   Stitches comment:  Running locked Suture removal (days):  14 Hemostasis achieved with: suture, pressure and electrodesiccation Outcome: patient tolerated procedure well with no complications   Post-procedure details: sterile dressing applied and wound care instructions given   Dressing type: petrolatum, bandage and pressure dressing   Specimen 1 - Surgical pathology Differential Diagnosis: R/O Epidermal cyst   Check Margins: No   Return in about 2 weeks (around 06/01/2024) for suture removal .  I, Clara Crisp, CMA, am acting as scribe for Harris Liming, MD .   Documentation: I have reviewed the above documentation for accuracy and completeness, and I agree with the above.  Harris Liming, MD

## 2024-05-23 LAB — SURGICAL PATHOLOGY

## 2024-05-24 ENCOUNTER — Ambulatory Visit: Payer: Self-pay | Admitting: Dermatology

## 2024-05-24 NOTE — Telephone Encounter (Signed)
 Patient advised pathology showed benign cyst, keep SR appt as scheduled. Lonell RAMAN., RMA

## 2024-05-24 NOTE — Telephone Encounter (Signed)
-----   Message from Lawson sent at 05/24/2024  9:09 AM EDT ----- Diagnosis left axilla :       EPIDERMAL INCLUSION CYST   Please call to share that excision removed benign cyst as expected and get update on surgical wound. Thank you. ----- Message ----- From: Interface, [facility] Sent: 05/23/2024   5:58 PM EDT To: Boneta Sharps, MD

## 2024-06-01 ENCOUNTER — Ambulatory Visit (INDEPENDENT_AMBULATORY_CARE_PROVIDER_SITE_OTHER): Admitting: Dermatology

## 2024-06-01 ENCOUNTER — Encounter: Payer: Self-pay | Admitting: Dermatology

## 2024-06-01 DIAGNOSIS — Z48817 Encounter for surgical aftercare following surgery on the skin and subcutaneous tissue: Secondary | ICD-10-CM

## 2024-06-01 DIAGNOSIS — Z5189 Encounter for other specified aftercare: Secondary | ICD-10-CM

## 2024-06-01 DIAGNOSIS — Z4802 Encounter for removal of sutures: Secondary | ICD-10-CM

## 2024-06-01 MED ORDER — DOXYCYCLINE HYCLATE 100 MG PO CAPS: 100.0000 mg | ORAL_CAPSULE | Freq: Two times a day (BID) | ORAL | 0 refills | Status: AC

## 2024-06-01 NOTE — Progress Notes (Signed)
 Encounter for Removal of Sutures - Incision site at the R axilla has whitish drainage. No redness or tenderness. Fibrin vs purulence. Continue mupirocin  and bandage daily until healed. Start doxy 100 mg BID x 7 days and recheck in 2 weeks - Wound cleansed, sutures removed, wound cleansed and steri strips applied.  - Discussed pathology results showing EIC  - Patient advised to keep steri-strips dry until they fall off. - Scars remodel for a full year. - Once steri-strips fall off, patient can apply over-the-counter silicone scar cream each night to help with scar remodeling if desired. - Patient advised to call with any concerns or if they notice any new or changing lesions.  Doxycycline should be taken with food to prevent nausea. Do not lay down for 30 minutes after taking. Be cautious with sun exposure and use good sun protection while on this medication. Pregnant women should not take this medication.

## 2024-06-01 NOTE — Patient Instructions (Signed)

## 2024-06-16 ENCOUNTER — Ambulatory Visit: Admitting: Dermatology

## 2024-06-16 ENCOUNTER — Encounter: Payer: Self-pay | Admitting: Dermatology

## 2024-06-16 DIAGNOSIS — Z48817 Encounter for surgical aftercare following surgery on the skin and subcutaneous tissue: Secondary | ICD-10-CM

## 2024-06-16 DIAGNOSIS — Z5189 Encounter for other specified aftercare: Secondary | ICD-10-CM

## 2024-06-16 NOTE — Progress Notes (Signed)
   Follow-Up Visit   Subjective  Gabriella Santiago is a 73 y.o. female who presents for the following: Wound check to L axilla. Patient was put on doxycycline  100 mg BID x 7 days and finished course, no side effects. Patient states that to her looks like it is healing well, has been applying mupirocin  ointment and for the past few days has kept it uncovered and looks less red to her.   The following portions of the chart were reviewed this encounter and updated as appropriate: medications, allergies, medical history  Review of Systems:  No other skin or systemic complaints except as noted in HPI or Assessment and Plan.  Objective  Well appearing patient in no apparent distress; mood and affect are within normal limits.  A focused examination was performed of the following areas: L axilla  Relevant exam findings are noted in the Assessment and Plan.    Assessment & Plan   WOUND CHECK  L axilla Exam: surgical wound has approximated and healed into a thin linear scar. Resolution of drainage. No tenderness  Treatment Plan: May D/C mupirocin  ointment.   VISIT FOR WOUND CHECK    Return for As scheduled, w/ Dr. Claudene, TBSE.  I, Jacquelynn V. Wilfred, CMA, am acting as scribe for Boneta Claudene, MD .   Documentation: I have reviewed the above documentation for accuracy and completeness, and I agree with the above.  Boneta Claudene, MD

## 2024-06-16 NOTE — Patient Instructions (Signed)

## 2024-06-17 ENCOUNTER — Other Ambulatory Visit: Payer: Self-pay | Admitting: Family Medicine

## 2024-06-17 DIAGNOSIS — Z0000 Encounter for general adult medical examination without abnormal findings: Secondary | ICD-10-CM

## 2024-06-19 ENCOUNTER — Telehealth: Payer: Self-pay | Admitting: Family Medicine

## 2024-06-19 DIAGNOSIS — E785 Hyperlipidemia, unspecified: Secondary | ICD-10-CM

## 2024-06-19 DIAGNOSIS — M810 Age-related osteoporosis without current pathological fracture: Secondary | ICD-10-CM

## 2024-06-19 DIAGNOSIS — E66811 Obesity, class 1: Secondary | ICD-10-CM

## 2024-06-19 DIAGNOSIS — E559 Vitamin D deficiency, unspecified: Secondary | ICD-10-CM

## 2024-06-19 DIAGNOSIS — I10 Essential (primary) hypertension: Secondary | ICD-10-CM

## 2024-06-19 DIAGNOSIS — Z131 Encounter for screening for diabetes mellitus: Secondary | ICD-10-CM

## 2024-06-19 NOTE — Telephone Encounter (Signed)
-----   Message from Veva JINNY Ferrari sent at 05/31/2024  3:48 PM EDT ----- Regarding: Lab orders for Wed, 7.23.25 Patient is scheduled for CPX labs, please order future labs, Thanks , Veva

## 2024-06-22 ENCOUNTER — Other Ambulatory Visit (INDEPENDENT_AMBULATORY_CARE_PROVIDER_SITE_OTHER): Payer: Medicare HMO

## 2024-06-22 ENCOUNTER — Ambulatory Visit: Payer: Self-pay | Admitting: Family Medicine

## 2024-06-22 DIAGNOSIS — E559 Vitamin D deficiency, unspecified: Secondary | ICD-10-CM | POA: Diagnosis not present

## 2024-06-22 DIAGNOSIS — I10 Essential (primary) hypertension: Secondary | ICD-10-CM | POA: Diagnosis not present

## 2024-06-22 DIAGNOSIS — E785 Hyperlipidemia, unspecified: Secondary | ICD-10-CM

## 2024-06-22 DIAGNOSIS — Z6831 Body mass index (BMI) 31.0-31.9, adult: Secondary | ICD-10-CM

## 2024-06-22 DIAGNOSIS — E6609 Other obesity due to excess calories: Secondary | ICD-10-CM

## 2024-06-22 DIAGNOSIS — Z131 Encounter for screening for diabetes mellitus: Secondary | ICD-10-CM | POA: Diagnosis not present

## 2024-06-22 DIAGNOSIS — E66811 Obesity, class 1: Secondary | ICD-10-CM

## 2024-06-22 DIAGNOSIS — M810 Age-related osteoporosis without current pathological fracture: Secondary | ICD-10-CM

## 2024-06-22 LAB — CBC WITH DIFFERENTIAL/PLATELET
Basophils Absolute: 0.1 K/uL (ref 0.0–0.1)
Basophils Relative: 1.1 % (ref 0.0–3.0)
Eosinophils Absolute: 0.1 K/uL (ref 0.0–0.7)
Eosinophils Relative: 3.2 % (ref 0.0–5.0)
HCT: 41.7 % (ref 36.0–46.0)
Hemoglobin: 14.4 g/dL (ref 12.0–15.0)
Lymphocytes Relative: 27.3 % (ref 12.0–46.0)
Lymphs Abs: 1.2 K/uL (ref 0.7–4.0)
MCHC: 34.6 g/dL (ref 30.0–36.0)
MCV: 89.2 fl (ref 78.0–100.0)
Monocytes Absolute: 0.3 K/uL (ref 0.1–1.0)
Monocytes Relative: 7.1 % (ref 3.0–12.0)
Neutro Abs: 2.8 K/uL (ref 1.4–7.7)
Neutrophils Relative %: 61.3 % (ref 43.0–77.0)
Platelets: 266 K/uL (ref 150.0–400.0)
RBC: 4.67 Mil/uL (ref 3.87–5.11)
RDW: 12.8 % (ref 11.5–15.5)
WBC: 4.5 K/uL (ref 4.0–10.5)

## 2024-06-22 LAB — COMPREHENSIVE METABOLIC PANEL WITH GFR
ALT: 17 U/L (ref 0–35)
AST: 19 U/L (ref 0–37)
Albumin: 4.3 g/dL (ref 3.5–5.2)
Alkaline Phosphatase: 69 U/L (ref 39–117)
BUN: 19 mg/dL (ref 6–23)
CO2: 28 meq/L (ref 19–32)
Calcium: 9.4 mg/dL (ref 8.4–10.5)
Chloride: 104 meq/L (ref 96–112)
Creatinine, Ser: 0.81 mg/dL (ref 0.40–1.20)
GFR: 73.04 mL/min (ref >60.00)
Glucose, Bld: 88 mg/dL (ref 70–99)
Potassium: 4.2 meq/L (ref 3.5–5.1)
Sodium: 140 meq/L (ref 135–145)
Total Bilirubin: 0.7 mg/dL (ref 0.2–1.2)
Total Protein: 6.6 g/dL (ref 6.0–8.3)

## 2024-06-22 LAB — LIPID PANEL
Cholesterol: 215 mg/dL — ABNORMAL HIGH (ref 0–200)
HDL: 64.8 mg/dL (ref >39.00)
LDL Cholesterol: 129 mg/dL — ABNORMAL HIGH (ref 0–99)
NonHDL: 150.54
Total CHOL/HDL Ratio: 3
Triglycerides: 108 mg/dL (ref 0.0–149.0)
VLDL: 21.6 mg/dL (ref 0.0–40.0)

## 2024-06-22 LAB — TSH: TSH: 1.69 u[IU]/mL (ref 0.35–5.50)

## 2024-06-22 LAB — VITAMIN D 25 HYDROXY (VIT D DEFICIENCY, FRACTURES): VITD: 43.1 ng/mL (ref 30.00–100.00)

## 2024-06-22 LAB — HEMOGLOBIN A1C: Hgb A1c MFr Bld: 4.9 % (ref 4.6–6.5)

## 2024-06-29 ENCOUNTER — Ambulatory Visit (INDEPENDENT_AMBULATORY_CARE_PROVIDER_SITE_OTHER): Payer: Medicare HMO | Admitting: Family Medicine

## 2024-06-29 ENCOUNTER — Encounter: Payer: Self-pay | Admitting: Family Medicine

## 2024-06-29 VITALS — BP 124/78 | HR 78 | Temp 98.0°F | Ht <= 58 in | Wt 160.2 lb

## 2024-06-29 DIAGNOSIS — E559 Vitamin D deficiency, unspecified: Secondary | ICD-10-CM | POA: Diagnosis not present

## 2024-06-29 DIAGNOSIS — E6609 Other obesity due to excess calories: Secondary | ICD-10-CM | POA: Diagnosis not present

## 2024-06-29 DIAGNOSIS — E785 Hyperlipidemia, unspecified: Secondary | ICD-10-CM | POA: Diagnosis not present

## 2024-06-29 DIAGNOSIS — Z131 Encounter for screening for diabetes mellitus: Secondary | ICD-10-CM | POA: Diagnosis not present

## 2024-06-29 DIAGNOSIS — Z6834 Body mass index (BMI) 34.0-34.9, adult: Secondary | ICD-10-CM | POA: Diagnosis not present

## 2024-06-29 DIAGNOSIS — E876 Hypokalemia: Secondary | ICD-10-CM | POA: Diagnosis not present

## 2024-06-29 DIAGNOSIS — M810 Age-related osteoporosis without current pathological fracture: Secondary | ICD-10-CM

## 2024-06-29 DIAGNOSIS — I10 Essential (primary) hypertension: Secondary | ICD-10-CM

## 2024-06-29 DIAGNOSIS — E2839 Other primary ovarian failure: Secondary | ICD-10-CM

## 2024-06-29 DIAGNOSIS — E66811 Obesity, class 1: Secondary | ICD-10-CM | POA: Diagnosis not present

## 2024-06-29 DIAGNOSIS — Z0000 Encounter for general adult medical examination without abnormal findings: Secondary | ICD-10-CM

## 2024-06-29 DIAGNOSIS — Z1211 Encounter for screening for malignant neoplasm of colon: Secondary | ICD-10-CM

## 2024-06-29 MED ORDER — ALENDRONATE SODIUM 70 MG PO TABS: 70.0000 mg | ORAL_TABLET | ORAL | 11 refills | Status: AC

## 2024-06-29 NOTE — Assessment & Plan Note (Signed)
 Dexa ordered

## 2024-06-29 NOTE — Assessment & Plan Note (Signed)
 bp in fair control at this time  BP Readings from Last 1 Encounters:  06/29/24 124/78   No changes needed Most recent labs reviewed  Disc lifstyle change with low sodium diet and exercise  Plan to continue losartan  hct 100-25 mg daily  Px klor con for low K

## 2024-06-29 NOTE — Progress Notes (Signed)
 Subjective:    Patient ID: Gabriella Santiago, female    DOB: [birth year], 74 y.o.   MRN: [id number]  HPI  Here for health maintenance exam and to review chronic medical problems   Wt Readings from Last 3 Encounters:  06/29/24 160 lb 4 oz (72.7 kg)  07/01/23 150 lb (68 kg)  06/29/23 150 lb (68 kg)   34.68 kg/m  Vitals:   06/29/24 0840  BP: 124/78  Pulse: 78  Temp: 98 F (36.7 C)  SpO2: 98%    Immunization History  Administered Date(s) Administered   Influenza Inj Mdck Quad Pf 01/16/2018   Influenza Whole 12/01/1998   Influenza, High Dose Seasonal PF 08/26/2019   Influenza,inj,Quad PF,6+ Mos 10/01/2018   Influenza-Unspecified 10/16/2015   PFIZER(Purple Top)SARS-COV-2 Vaccination 01/11/2020, 02/01/2020, 12/06/2020   Pneumococcal Conjugate-13 03/23/2018   Pneumococcal Polysaccharide-23 06/07/2019   Td 07/25/1998, 03/01/2008    Health Maintenance Due  Topic Date Due   Hepatitis C Screening  Never done   Shingrix - will get at pharmacy  Tetanus shot 09  Mammogram 07/2023  at the [facility] , is scheduled aug 2  Self breast exam-no lumps   Gyn health No problems    Colon cancer screening -colonoscopy 05/2017 - 10 year recall  Bone health  Dexa  06/2019 OSTEOPOROSIS  Past alendronate  , declines a 2nd course  Falls-none  Fractures-none  Supplements  Last vitamin D  Lab Results  Component Value Date   VD25OH 43.10 06/22/2024    Exercise :  Walking 20 minutes per day (inside or outside) Has some exercise bands and weight  Hard to fit in - taking care of 59 yo mother and works 4 h per day     Mood    06/29/2024    8:47 AM 07/01/2023    2:44 PM 06/29/2023    9:57 AM 06/23/2022    9:20 AM 06/10/2022   10:58 AM  Depression screen PHQ 2/9  Decreased Interest 0 0 0 0 0  Down, Depressed, Hopeless 0 0 0 0 0  PHQ - 2 Score 0 0 0 0 0  Altered sleeping 0 0 0  0  Tired, decreased energy 0 0 0  0  Change in appetite 0 0 0  0  Feeling bad or failure about  yourself  0 0 0  0  Trouble concentrating 0 0 0  0  Moving slowly or fidgety/restless 0 0 0  0  Suicidal thoughts 0 0 0  0  PHQ-9 Score 0 0 0  0  Difficult doing work/chores Not difficult at all Not difficult at all Not difficult at all  Not difficult at all   HTN bp is stable today  No cp or palpitations or headaches or edema  No side effects to medicines  BP Readings from Last 3 Encounters:  06/29/24 124/78  06/29/23 118/80  01/07/23 (!) 158/97    Losartan  hct 100-25 mg daily   Klor con for K   Lab Results  Component Value Date   NA 140 06/22/2024   K 4.2 06/22/2024   CO2 28 06/22/2024   GLUCOSE 88 06/22/2024   BUN 19 06/22/2024   CREATININE 0.81 06/22/2024   CALCIUM 9.4 06/22/2024   GFR 73.04 06/22/2024   GFRNONAA 78.77 05/23/2009    DM screen Lab Results  Component Value Date   HGBA1C 4.9 06/22/2024   HGBA1C 5.0 06/22/2023    Hyperlipidemia Lab Results  Component Value Date   CHOL 215 (H) 06/22/2024  CHOL 183 06/22/2023   CHOL 190 06/16/2022   Lab Results  Component Value Date   HDL 64.80 06/22/2024   HDL 58.00 06/22/2023   HDL 57.70 06/16/2022   Lab Results  Component Value Date   LDLCALC 129 (H) 06/22/2024   LDLCALC 106 (H) 06/22/2023   LDLCALC 104 (H) 06/16/2022   Lab Results  Component Value Date   TRIG 108.0 06/22/2024   TRIG 96.0 06/22/2023   TRIG 143.0 06/16/2022   Lab Results  Component Value Date   CHOLHDL 3 06/22/2024   CHOLHDL 3 06/22/2023   CHOLHDL 3 06/16/2022   Lab Results  Component Value Date   LDLDIRECT 116.8 01/24/2013   The 10-year ASCVD risk score (Arnett DK, et al., 2019) is: 13.1%   Values used to calculate the score:     Age: 24 years     Clincally relevant sex: Female     Is Non-Hispanic African American: No     Diabetic: No     Tobacco smoker: No     Systolic Blood Pressure: 124 mmHg     Is BP treated: Yes     HDL Cholesterol: 64.8 mg/dL     Total Cholesterol: 215 mg/dL  Father had high cholesterol   Trying not to eat fries  Very little red meat    Lab Results  Component Value Date   ALT 17 06/22/2024   AST 19 06/22/2024   ALKPHOS 69 06/22/2024   BILITOT 0.7 06/22/2024   Lab Results  Component Value Date   TSH 1.69 06/22/2024   Lab Results  Component Value Date   WBC 4.5 06/22/2024   HGB 14.4 06/22/2024   HCT 41.7 06/22/2024   MCV 89.2 06/22/2024   PLT 266.0 06/22/2024     Patient Active Problem List   Diagnosis Date Noted   Diabetes mellitus screening 06/21/2023   Hypokalemia 06/23/2022   Dermatochalasis of both upper eyelids 04/09/2021   Ptosis of both eyelids 04/09/2021   Visual field defects 04/09/2021   Low back pain 06/07/2019   Vitamin D  deficiency 03/23/2018   Screening mammogram, encounter for 03/17/2017   Estrogen deficiency 03/17/2017   Obesity 05/11/2015   Encounter for routine gynecological examination 01/31/2013   Colon cancer screening 01/31/2013   Routine general medical examination at a health care facility 01/23/2013   Hyperlipidemia, mild 03/01/2008   Essential hypertension 03/01/2008   GERD 03/01/2008   FIBROCYSTIC BREAST DISEASE 03/01/2008   POSTMENOPAUSAL STATUS 03/01/2008   Osteoporosis 03/01/2008   Past Medical History:  Diagnosis Date   Actinic keratosis    Allergy    Anemia    years ago    Hypertension    Superficial basal cell carcinoma 09/19/2021   right forearm, EDC 11/12/2021   Past Surgical History:  Procedure Laterality Date   TUBAL LIGATION     Social History   Tobacco Use   Smoking status: Never   Smokeless tobacco: Never  Vaping Use   Vaping status: Never Used  Substance Use Topics   Alcohol use: No    Alcohol/week: 0.0 standard drinks of alcohol   Drug use: No   Family History  Problem Relation Age of Onset   Breast cancer Cousin    Colon cancer Neg Hx    Colon polyps Neg Hx    Esophageal cancer Neg Hx    Rectal cancer Neg Hx    Stomach cancer Neg Hx    Allergies  Allergen Reactions    Shellfish Allergy Rash  Current Outpatient Medications on File Prior to Visit  Medication Sig Dispense Refill   Ascorbic Acid (VITAMIN C PO) Take by mouth.     b complex vitamins capsule Take 1 capsule by mouth daily.     Cholecalciferol (VITAMIN D3) 125 MCG (5000 UT) CAPS Take 1 capsule by mouth daily.      ketoconazole  (NIZORAL ) 2 % cream APPLY TWICE DAILY AS NEEDED FOR RASH ON ABDOMEN 60 g 2   losartan -hydrochlorothiazide  (HYZAAR) 100-25 MG tablet Take 1 tablet by mouth daily. 90 tablet 3   meloxicam  (MOBIC ) 15 MG tablet TAKE 1 TABLET (15 MG TOTAL) BY MOUTH DAILY AS NEEDED FOR PAIN (BACK PAIN). WITH FOOD 30 tablet 5   Multiple Vitamins-Minerals (ZINC PO) Take by mouth.     mupirocin  ointment (BACTROBAN ) 2 % Apply to skin qd-bid 22 g 0   potassium chloride  (KLOR-CON ) 10 MEQ tablet TAKE 1 TABLET BY MOUTH EVERY DAY 90 tablet 2   No current facility-administered medications on file prior to visit.    Review of Systems  Constitutional:  Negative for activity change, appetite change, fever and unexpected weight change.  HENT:  Negative for congestion, ear pain, rhinorrhea, sinus pressure and sore throat.   Eyes:  Negative for pain, redness and visual disturbance.  Respiratory:  Negative for cough, shortness of breath and wheezing.   Cardiovascular:  Negative for chest pain and palpitations.  Gastrointestinal:  Negative for abdominal pain, blood in stool, constipation and diarrhea.  Endocrine: Negative for polydipsia and polyuria.  Genitourinary:  Negative for dysuria, frequency and urgency.  Musculoskeletal:  Positive for arthralgias. Negative for back pain and myalgias.  Skin:  Negative for pallor and rash.  Allergic/Immunologic: Negative for environmental allergies.  Neurological:  Negative for dizziness, syncope and headaches.  Hematological:  Negative for adenopathy. Does not bruise/bleed easily.  Psychiatric/Behavioral:  Negative for decreased concentration and dysphoric mood. The  patient is not nervous/anxious.        Objective:   Physical Exam Constitutional:      General: She is not in acute distress.    Appearance: Normal appearance. She is well-developed. She is obese. She is not ill-appearing or diaphoretic.  HENT:     Head: Normocephalic and atraumatic.     Right Ear: Tympanic membrane, ear canal and external ear normal.     Left Ear: Tympanic membrane, ear canal and external ear normal.     Nose: Nose normal. No congestion.     Mouth/Throat:     Mouth: Mucous membranes are moist.     Pharynx: Oropharynx is clear. No posterior oropharyngeal erythema.  Eyes:     General: No scleral icterus.    Extraocular Movements: Extraocular movements intact.     Conjunctiva/sclera: Conjunctivae normal.     Pupils: Pupils are equal, round, and reactive to light.  Neck:     Thyroid : No thyromegaly.     Vascular: No carotid bruit or JVD.  Cardiovascular:     Rate and Rhythm: Normal rate and regular rhythm.     Pulses: Normal pulses.     Heart sounds: Normal heart sounds.     No gallop.  Pulmonary:     Effort: Pulmonary effort is normal. No respiratory distress.     Breath sounds: Normal breath sounds. No wheezing.     Comments: Good air exch Chest:     Chest wall: No tenderness.  Abdominal:     General: Bowel sounds are normal. There is no distension or abdominal bruit.  Palpations: Abdomen is soft. There is no mass.     Tenderness: There is no abdominal tenderness.     Hernia: No hernia is present.  Genitourinary:    Comments: Breast exam: No mass, nodules, thickening, tenderness, bulging, retraction, inflamation, nipple discharge or skin changes noted.  No axillary or clavicular LA.     Musculoskeletal:        General: No tenderness. Normal range of motion.     Cervical back: Normal range of motion and neck supple. No rigidity. No muscular tenderness.     Right lower leg: No edema.     Left lower leg: No edema.     Comments: Mild kyphosis  Petite  frame   Lymphadenopathy:     Cervical: No cervical adenopathy.  Skin:    General: Skin is warm and dry.     Coloration: Skin is not pale.     Findings: No erythema or rash.     Comments: Solar lentigines diffusely   Neurological:     Mental Status: She is alert. Mental status is at baseline.     Cranial Nerves: No cranial nerve deficit.     Motor: No abnormal muscle tone.     Coordination: Coordination normal.     Gait: Gait normal.     Deep Tendon Reflexes: Reflexes are normal and symmetric. Reflexes normal.  Psychiatric:        Mood and Affect: Mood normal.        Cognition and Memory: Cognition and memory normal.           Assessment & Plan:   Problem List Items Addressed This Visit       Cardiovascular and Mediastinum   Essential hypertension   bp in fair control at this time  BP Readings from Last 1 Encounters:  06/29/24 124/78   No changes needed Most recent labs reviewed  Disc lifstyle change with low sodium diet and exercise  Plan to continue losartan  hct 100-25 mg daily  Px klor con for low K        Musculoskeletal and Integument   Osteoporosis   Dexa ordered  No falls or fracture  Is considering another course of alendronate -given handout and written prescription  Discussed potential side effects and proper way to take  D level is in normal range  Encouraged more strength building exercise        Relevant Medications   alendronate  (FOSAMAX ) 70 MG tablet   Other Relevant Orders   DG Bone Density     Other   Vitamin D  deficiency   Vitamin D  level is therapeutic with current supplementation Disc importance of this to bone and overall health Last vitamin D  Lab Results  Component Value Date   VD25OH 43.10 06/22/2024         Routine general medical examination at a health care facility - Primary   Reviewed health habits including diet and exercise and skin cancer prevention Reviewed appropriate screening tests for age  Also reviewed  health mt list, fam hx and immunization status , as well as social and family history   See HPI Labs reviewed and ordered Health Maintenance  Topic Date Due   Hepatitis C Screening  Never done   DTaP/Tdap/Td vaccine (3 - Tdap) 06/29/2025*   Zoster (Shingles) Vaccine (1 of 2) 06/29/2026*   COVID-19 Vaccine (4 - 2024-25 season) 07/15/2026*   Flu Shot  07/01/2024   Medicare Annual Wellness Visit  06/29/2025   Mammogram  07/01/2025  Colon Cancer Screening  05/19/2027   Pneumococcal Vaccine for age over 90  Completed   DEXA scan (bone density measurement)  Completed   Hepatitis B Vaccine  Aged Out   HPV Vaccine  Aged Out   Meningitis B Vaccine  Aged Out  *Topic was postponed. The date shown is not the original due date.    Plans to get shingrix vaccine and tetanus booster at pharmacy  Mammo scheduled aug 9 Discussed fall prevention, supplements and exercise for bone density  Dexa ordered      Obesity   Discussed how this problem influences overall health and the risks it imposes  Reviewed plan for weight loss with lower calorie diet (via better food choices (lower glycemic and portion control) along with exercise building up to or more than 30 minutes 5 days per week including some aerobic activity and strength training         Hypokalemia   Lab Results  Component Value Date   K 4.2 06/22/2024   Continue klor con 10 meq daily      Hyperlipidemia, mild   Disc goals for lipids and reasons to control them Rev last labs with pt Rev low sat fat diet in detail LDL up to 129  ASCVD risk 86.8%  Consider cardiac screening, treatment if this does not improve with better diet       Estrogen deficiency   Dexa ordered       Diabetes mellitus screening   Lab Results  Component Value Date   HGBA1C 4.9 06/22/2024   HGBA1C 5.0 06/22/2023   disc imp of low glycemic diet and wt loss to prevent DM2       Colon cancer screening   Colonoscopy 2018 with 10 y recall

## 2024-06-29 NOTE — Assessment & Plan Note (Signed)
 Disc goals for lipids and reasons to control them Rev last labs with pt Rev low sat fat diet in detail LDL up to 129  ASCVD risk 86.8%  Consider cardiac screening, treatment if this does not improve with better diet

## 2024-06-29 NOTE — Assessment & Plan Note (Signed)
 Lab Results  Component Value Date   K 4.2 06/22/2024   Continue klor con 10 meq daily

## 2024-06-29 NOTE — Assessment & Plan Note (Signed)
 Discussed how this problem influences overall health and the risks it imposes  Reviewed plan for weight loss with lower calorie diet (via better food choices (lower glycemic and portion control) along with exercise building up to or more than 30 minutes 5 days per week including some aerobic activity and strength training

## 2024-06-29 NOTE — Assessment & Plan Note (Signed)
 Dexa ordered  No falls or fracture  Is considering another course of alendronate -given handout and written prescription  Discussed potential side effects and proper way to take  D level is in normal range  Encouraged more strength building exercise

## 2024-06-29 NOTE — Patient Instructions (Addendum)
 If you are interested in the new shingles vaccine (Shingrix) - call your local pharmacy to check on coverage and availability   Get your tetanus shot at the pharmacy also   Aim for exercise 5 days per week  Keep walking Add some strength training to your routine, this is important for bone and brain health and can reduce your risk of falls and help your body use insulin properly and regulate weight  Light weights, exercise bands , and internet videos are a good way to start  Yoga (chair or regular), machines , floor exercises or a gym with machines are also good options    For cholesterol :  Avoid red meat/ fried foods/ egg yolks/ fatty breakfast meats/ butter, cheese and high fat dairy/ and shellfish       You have an order for:  []   3D Mammogram  [x]   Bone Density     Please call for appointment:   []   [facility]  [address] [place] [address]  [phone]  []   [facility])   [address]  [place], [place] [address]  [phone]  []   [facility]      [address] [place], [place]        [phone]         []   [facility]  [address] [place], [place]  [phone]  [x]  [facility]   [address]   [place], [place] [address]  [phone]  []  [facility]  [address] [place], [place] [address] [phone]    Make sure to wear two piece clothing  No lotions powders or deodorants the day of the appointment Make sure to bring picture ID and insurance card.  Bring list of medications you are currently taking including any supplements.   Schedule your screening mammogram through MyChart!   Select [facility] imaging sites can now be scheduled through MyChart.  Log into your MyChart  account.  Go to 'Visit' (or 'Appointments' if  on mobile App) --> Schedule an  Appointment  Under 'Select a Reason for Visit' choose the Mammogram  Screening option.  Complete the pre-visit questions  and select the time and place that  best fits your schedule

## 2024-06-29 NOTE — Assessment & Plan Note (Signed)
 Lab Results  Component Value Date   HGBA1C 4.9 06/22/2024   HGBA1C 5.0 06/22/2023   disc imp of low glycemic diet and wt loss to prevent DM2

## 2024-06-29 NOTE — Assessment & Plan Note (Signed)
Colonoscopy 2018 with 10 y recall

## 2024-06-29 NOTE — Assessment & Plan Note (Signed)
 Vitamin D  level is therapeutic with current supplementation Disc importance of this to bone and overall health Last vitamin D  Lab Results  Component Value Date   VD25OH 43.10 06/22/2024

## 2024-06-29 NOTE — Assessment & Plan Note (Signed)
 Reviewed health habits including diet and exercise and skin cancer prevention Reviewed appropriate screening tests for age  Also reviewed health mt list, fam hx and immunization status , as well as social and family history   See HPI Labs reviewed and ordered Health Maintenance  Topic Date Due   Hepatitis C Screening  Never done   DTaP/Tdap/Td vaccine (3 - Tdap) 06/29/2025*   Zoster (Shingles) Vaccine (1 of 2) 06/29/2026*   COVID-19 Vaccine (4 - 2024-25 season) 07/15/2026*   Flu Shot  07/01/2024   Medicare Annual Wellness Visit  06/29/2025   Mammogram  07/01/2025   Colon Cancer Screening  05/19/2027   Pneumococcal Vaccine for age over 11  Completed   DEXA scan (bone density measurement)  Completed   Hepatitis B Vaccine  Aged Out   HPV Vaccine  Aged Out   Meningitis B Vaccine  Aged Out  *Topic was postponed. The date shown is not the original due date.    Plans to get shingrix vaccine and tetanus booster at pharmacy  Mammo scheduled aug 9 Discussed fall prevention, supplements and exercise for bone density  Dexa ordered

## 2024-07-07 ENCOUNTER — Ambulatory Visit
Admission: RE | Admit: 2024-07-07 | Discharge: 2024-07-07 | Disposition: A | Discharge status: Home / Self Care | Source: Ambulatory Visit | Attending: Family Medicine | Admitting: Family Medicine

## 2024-07-07 DIAGNOSIS — Z1231 Encounter for screening mammogram for malignant neoplasm of breast: Secondary | ICD-10-CM | POA: Diagnosis not present

## 2024-07-07 DIAGNOSIS — Z0000 Encounter for general adult medical examination without abnormal findings: Secondary | ICD-10-CM

## 2024-07-11 ENCOUNTER — Ambulatory Visit: Payer: Self-pay | Admitting: Family Medicine

## 2024-08-09 ENCOUNTER — Other Ambulatory Visit: Payer: Self-pay | Admitting: Family Medicine

## 2024-08-18 ENCOUNTER — Telehealth: Payer: Self-pay

## 2024-08-18 ENCOUNTER — Encounter

## 2024-08-18 NOTE — Telephone Encounter (Signed)
 Copied from CRM [id number]. Topic: Appointments - Scheduling Inquiry for Clinic >> Aug 18, 2024  4:09 PM Harlene ORN wrote: Reason for CRM: Patient missed Medicare telephone appointment. Unable to schedule due to restrictions. Please call the patient back to reschedule visit.

## 2024-08-22 ENCOUNTER — Other Ambulatory Visit: Payer: Self-pay | Admitting: Family Medicine

## 2024-08-22 DIAGNOSIS — I10 Essential (primary) hypertension: Secondary | ICD-10-CM

## 2024-10-12 ENCOUNTER — Ambulatory Visit: Admitting: Internal Medicine

## 2025-04-20 ENCOUNTER — Ambulatory Visit: Admitting: Dermatology

## 2025-06-20 ENCOUNTER — Ambulatory Visit

## 2025-06-21 ENCOUNTER — Ambulatory Visit

## 2025-06-26 ENCOUNTER — Other Ambulatory Visit

## 2025-07-03 ENCOUNTER — Encounter: Admitting: Family Medicine

## 2025-08-22 ENCOUNTER — Ambulatory Visit
